# Patient Record
Sex: Female | Born: 1955
Health system: Southern US, Community
[De-identification: ages and names within clinical notes are randomized; demographics above are authoritative.]

## PROBLEM LIST (undated history)

## (undated) DIAGNOSIS — R112 Nausea with vomiting, unspecified: Secondary | ICD-10-CM

## (undated) DIAGNOSIS — Z9889 Other specified postprocedural states: Secondary | ICD-10-CM

## (undated) DIAGNOSIS — T7840XA Allergy, unspecified, initial encounter: Secondary | ICD-10-CM

## (undated) DIAGNOSIS — Z789 Other specified health status: Secondary | ICD-10-CM

## (undated) HISTORY — DX: Allergy, unspecified, initial encounter: T78.40XA

## (undated) HISTORY — PX: CHOLECYSTECTOMY: SHX55

---

## 1998-02-19 ENCOUNTER — Other Ambulatory Visit: Admission: RE | Admit: 1998-02-19 | Discharge: 1998-02-19 | Payer: Self-pay | Admitting: Family Medicine

## 1998-08-07 ENCOUNTER — Ambulatory Visit (HOSPITAL_BASED_OUTPATIENT_CLINIC_OR_DEPARTMENT_OTHER): Admission: RE | Admit: 1998-08-07 | Discharge: 1998-08-07 | Payer: Self-pay | Admitting: General Surgery

## 1999-08-30 ENCOUNTER — Other Ambulatory Visit: Admission: RE | Admit: 1999-08-30 | Discharge: 1999-08-30 | Payer: Self-pay | Admitting: Family Medicine

## 2000-09-21 ENCOUNTER — Other Ambulatory Visit: Admission: RE | Admit: 2000-09-21 | Discharge: 2000-09-21 | Payer: Self-pay | Admitting: Family Medicine

## 2000-09-27 ENCOUNTER — Encounter: Admission: RE | Admit: 2000-09-27 | Discharge: 2000-09-27 | Payer: Self-pay | Admitting: Family Medicine

## 2000-09-27 ENCOUNTER — Encounter: Payer: Self-pay | Admitting: Family Medicine

## 2003-06-13 ENCOUNTER — Other Ambulatory Visit: Admission: RE | Admit: 2003-06-13 | Discharge: 2003-06-13 | Payer: Self-pay | Admitting: Family Medicine

## 2004-10-29 ENCOUNTER — Other Ambulatory Visit: Admission: RE | Admit: 2004-10-29 | Discharge: 2004-10-29 | Payer: Self-pay | Admitting: Family Medicine

## 2005-10-13 ENCOUNTER — Other Ambulatory Visit: Admission: RE | Admit: 2005-10-13 | Discharge: 2005-10-13 | Payer: Self-pay | Admitting: Family Medicine

## 2013-02-06 ENCOUNTER — Ambulatory Visit (INDEPENDENT_AMBULATORY_CARE_PROVIDER_SITE_OTHER): Payer: BC Managed Care – PPO

## 2013-02-06 DIAGNOSIS — Z23 Encounter for immunization: Secondary | ICD-10-CM

## 2013-03-04 ENCOUNTER — Other Ambulatory Visit: Payer: Self-pay | Admitting: Family Medicine

## 2013-03-04 ENCOUNTER — Ambulatory Visit (INDEPENDENT_AMBULATORY_CARE_PROVIDER_SITE_OTHER): Payer: BC Managed Care – PPO

## 2013-03-04 ENCOUNTER — Ambulatory Visit (INDEPENDENT_AMBULATORY_CARE_PROVIDER_SITE_OTHER): Payer: BC Managed Care – PPO | Admitting: Family Medicine

## 2013-03-04 ENCOUNTER — Encounter: Payer: Self-pay | Admitting: Family Medicine

## 2013-03-04 VITALS — BP 124/80 | HR 82 | Temp 98.2°F | Ht 64.0 in | Wt 201.0 lb

## 2013-03-04 DIAGNOSIS — M25529 Pain in unspecified elbow: Secondary | ICD-10-CM

## 2013-03-04 DIAGNOSIS — M25521 Pain in right elbow: Secondary | ICD-10-CM

## 2013-03-04 DIAGNOSIS — M25429 Effusion, unspecified elbow: Secondary | ICD-10-CM

## 2013-03-04 DIAGNOSIS — M25421 Effusion, right elbow: Secondary | ICD-10-CM

## 2013-03-04 DIAGNOSIS — R52 Pain, unspecified: Secondary | ICD-10-CM

## 2013-03-04 MED ORDER — NAPROXEN 500 MG PO TABS: 500.0000 mg | ORAL_TABLET | Freq: Two times a day (BID) | ORAL | Status: DC

## 2013-03-04 MED ORDER — KETOROLAC TROMETHAMINE 30 MG/ML IJ SOLN
30.0000 mg | Freq: Once | INTRAMUSCULAR | Status: AC
  Administered 2013-03-04: 30 mg via INTRAMUSCULAR

## 2013-03-04 NOTE — Patient Instructions (Signed)
Carpal Tunnel Release Carpal tunnel release is done to relieve the pressure on the nerves and tendons on the bottom side of your wrist.  LET YOUR CAREGIVER KNOW ABOUT:   Allergies to food or medicine.  Medicines taken, including vitamins, herbs, eyedrops, over-the-counter medicines, and creams.  Use of steroids (by mouth or creams).  Previous problems with anesthetics or numbing medicines.  History of bleeding problems or blood clots.  Previous surgery.  Other health problems, including diabetes and kidney problems.  Possibility of pregnancy, if this applies. RISKS AND COMPLICATIONS  Some problems that may happen after this procedure include:  Infection.  Damage to the nerves, arteries or tendons could occur. This would be very uncommon.  Bleeding. BEFORE THE PROCEDURE   This surgery may be done while you are asleep (general anesthetic) or may be done under a block where only your forearm and the surgical area is numb.  If the surgery is done under a block, the numbness will gradually wear off within several hours after surgery. HOME CARE INSTRUCTIONS   Have a responsible person with you for 24 hours.  Do not drive a car or use public transportation for 24 hours.  Only take over-the-counter or prescription medicines for pain, discomfort, or fever as directed by your caregiver. Take them as directed.  You may put ice on the palm side of the affected wrist.  Put ice in a plastic bag.  Place a towel between your skin and the bag.  Leave the ice on for 20 to 30 minutes, 4 times per day.  If you were given a splint to keep your wrist from bending, use it as directed. It is important to wear the splint at night or as directed. Use the splint for as long as you have pain or numbness in your hand, arm, or wrist. This may take 1 to 2 months.  Keep your hand raised (elevated) above the level of your heart as much as possible. This keeps swelling down and helps with  discomfort.  Change bandages (dressings) as directed.  Keep the wound clean and dry. SEEK MEDICAL CARE IF:   You develop pain not relieved with medications.  You develop numbness of your hand.  You develop bleeding from your surgical site.  You have an oral temperature above 102 F (38.9 C).  You develop redness or swelling of the surgical site.  You develop new, unexplained problems. SEEK IMMEDIATE MEDICAL CARE IF:   You develop a rash.  You have difficulty breathing.  You develop any reaction or side effects to medications given. Document Released: 07/16/2003 Document Revised: 07/18/2011 Document Reviewed: 03/01/2007 [employer] Patient Information 2014 [employer].  

## 2013-03-04 NOTE — Progress Notes (Signed)
  Subjective:    Patient ID: Catherine Hudson, female    DOB: [birth year], 57 y.o.   MRN: [id number]  HPI This 57 y.o. female presents for evaluation of right elbow pain and discomfort after a fall The other day when blowing leaves..   Review of Systems C/o right elbow pain No chest pain, SOB, HA, dizziness, vision change, N/V, diarrhea, constipation, dysuria, urinary urgency or frequency, myalgias, arthralgias or rash.     Objective:   Physical Exam  Vital signs noted  Well developed well nourished female.  HEENT - Head atraumatic Normocephalic                Throat - oropharanx wnl Respiratory - Lungs CTA bilateral Cardiac - RRR S1 and S2 without murmur MS - TTP right elbow  Xray right elbow - No fx Prelimnary reading by Angeline Slim      Assessment & Plan:  Pain in joint, upper arm, right - Plan: DG Forearm Right, ketorolac (TORADOL) 30 MG/ML injection 30 mg, naproxen (NAPROSYN) 500 MG tablet Sling right elbow, work note for 2 days.  Follow up prn.  Deatra Canter FNP

## 2013-03-05 ENCOUNTER — Encounter: Payer: Self-pay | Admitting: Family Medicine

## 2013-03-05 ENCOUNTER — Telehealth: Payer: Self-pay | Admitting: *Deleted

## 2013-03-05 NOTE — Addendum Note (Signed)
Addended by: Bearl Mulberry on: 03/05/2013 03:44 PM   Modules accepted: Orders

## 2013-04-22 NOTE — Telephone Encounter (Signed)
NA

## 2013-09-10 ENCOUNTER — Telehealth: Payer: Self-pay | Admitting: Family Medicine

## 2013-09-10 NOTE — Telephone Encounter (Signed)
appt made

## 2013-09-11 ENCOUNTER — Encounter: Payer: Self-pay | Admitting: Nurse Practitioner

## 2013-09-11 ENCOUNTER — Ambulatory Visit (INDEPENDENT_AMBULATORY_CARE_PROVIDER_SITE_OTHER): Payer: BC Managed Care – PPO | Admitting: Nurse Practitioner

## 2013-09-11 VITALS — BP 118/81 | HR 81 | Temp 97.9°F | Ht 64.0 in | Wt 203.8 lb

## 2013-09-11 DIAGNOSIS — F411 Generalized anxiety disorder: Secondary | ICD-10-CM | POA: Insufficient documentation

## 2013-09-11 DIAGNOSIS — R079 Chest pain, unspecified: Secondary | ICD-10-CM

## 2013-09-11 DIAGNOSIS — K219 Gastro-esophageal reflux disease without esophagitis: Secondary | ICD-10-CM

## 2013-09-11 MED ORDER — PANTOPRAZOLE SODIUM 40 MG PO TBEC: 40.0000 mg | DELAYED_RELEASE_TABLET | Freq: Every day | ORAL | Status: DC

## 2013-09-11 NOTE — Progress Notes (Signed)
   Subjective:    Patient ID: Catherine Hudson, female    DOB: 1955/08/31, 58 y.o.   MRN: [id number]  HPI Patient in c/o when she gets up in the morning she feels nauseated,chest pain and blurred vision- Is happening every morning for the last week- Was intermittent prior to that. Sometimes occurs late rin the day as well.    Review of Systems  Constitutional: Positive for appetite change (decreased appetite). Negative for fever and chills.  HENT: Negative.   Respiratory: Negative.   Cardiovascular: Positive for chest pain. Negative for palpitations and leg swelling.  Gastrointestinal: Positive for nausea. Negative for diarrhea and constipation.  Genitourinary: Negative.   Musculoskeletal: Negative.   Psychiatric/Behavioral: Negative.   All other systems reviewed and are negative.      Objective:   Physical Exam  Constitutional: She is oriented to person, place, and time. She appears well-developed and well-nourished.  Cardiovascular: Normal rate, regular rhythm and normal heart sounds.   Pulmonary/Chest: Effort normal and breath sounds normal.  Abdominal: Soft. Bowel sounds are normal. She exhibits no distension and no mass. There is no tenderness. There is no rebound and no guarding.  Neurological: She is alert and oriented to person, place, and time.  Skin: Skin is warm and dry.  Psychiatric: She has a normal mood and affect. Her behavior is normal. Judgment and thought content normal.   BP 118/81  Pulse 81  Temp(Src) 97.9 F (36.6 C) (Oral)  Ht 5\' 4"  (1.626 m)  Wt 203 lb 12.8 oz (92.443 kg)  BMI 34.96 kg/m2 EKG Lillard Anesnormal-Mary-Margaret Kalina Morabito, FNP        Assessment & Plan:   1. Chest pain   2. GERD (gastroesophageal reflux disease)    Meds ordered this encounter  Medications  . diazepam (VALIUM) 10 MG tablet    Sig: Take 10 mg by mouth every 6 (six) hours as needed for anxiety.  . pantoprazole (PROTONIX) 40 MG tablet    Sig: Take 1 tablet (40 mg total) by mouth  daily.    Dispense:  30 tablet    Refill:  3    Order Specific Question:  Supervising Provider    Answer:  Ernestina PennaMOORE, DONALD W [[id number]]  smoking cessation avoid spicy and fatty foods Discussed possible hiatal henria- if no better will refer to GI No NSAIDs RTO prn Needs appointment for annual physical with blood work.  Mary-Margaret Daphine DeutscherMartin, FNP

## 2013-09-11 NOTE — Patient Instructions (Signed)
Hiatal Hernia A hiatal hernia occurs when a part of the stomach slides above the diaphragm. The diaphragm is the thin muscle separating the belly (abdomen) from the chest. A hiatal hernia can be something you are born with or develop over time. Hiatal hernias may allow stomach acid to flow back into your esophagus, the tube which carries food from your mouth to your stomach. If this acid causes problems it is called GERD (gastro-esophageal reflux disease).  SYMPTOMS  Common symptoms of GERD are heartburn (burning in your chest). This is worse when lying down or bending over. It may also cause belching and indigestion. Some of the things which make GERD worse are:  Increased weight pushes on stomach making acid rise more easily.  Smoking markedly increases acid production.  Alcohol decreases lower esophageal sphincter pressure (valve between stomach and esophagus), allowing acid from stomach into esophagus.  Late evening meals and going to bed with a full stomach increases pressure.  Anything that causes an increase in acid production.  Lower esophageal sphincter incompetence. DIAGNOSIS  Hiatal hernia is often diagnosed with x-rays of your stomach and small bowel. This is called an UGI (upper gastrointestinal x-ray). Sometimes a gastroscopic procedure is done. This is a procedure where your caregiver uses a flexible instrument to look into the stomach and small bowel. HOME CARE INSTRUCTIONS   Try to achieve and maintain an ideal body weight.  Avoid drinking alcoholic beverages.  Stop smoking.  Put the head of your bed on 4 to 6 inch blocks. This will keep your head and esophagus higher than your stomach. If you cannot use blocks, sleep with several pillows under your head and shoulders.  Over-the-counter medications will decrease acid production. Your caregiver can also prescribe medications for this. Take as directed.  1/2 to 1 teaspoon of an antacid taken every hour while awake, with  meals and at bedtime, will neutralize acid.  Do not take aspirin, ibuprofen (Advil or Motrin), or other nonsteroidal anti-inflammatory drugs.  Do not wear tight clothing around your chest or stomach.  Eat smaller meals and eat more frequently. This keeps your stomach from getting too full. Eat slowly.  Do not lie down for 2 or 3 hours after eating. Do not eat or drink anything 1 to 2 hours before going to bed.  Avoid caffeine beverages (colas, coffee, cocoa, tea), fatty foods, citrus fruits and all other foods and drinks that contain acid and that seem to increase the problems.  Avoid bending over, especially after eating. Also avoid straining during bowel movements or when urinating or lifting things. Anything that increases the pressure in your belly increases the amount of acid that may be pushed up into your esophagus. SEEK IMMEDIATE MEDICAL CARE IF:  There is change in location (pain in arms, neck, jaw, teeth or back) of your pain, or the pain is getting worse.  You also experience nausea, vomiting, sweating (diaphoresis), or shortness of breath.  You develop continual vomiting, vomit blood or coffee ground material, have bright red blood in your stools, or have black tarry stools. Some of these symptoms could signal other problems such as heart disease. MAKE SURE YOU:   Understand these instructions.  Monitor your condition.  Contact your caregiver if you are not doing well or are getting worse. Document Released: 07/16/2003 Document Revised: 07/18/2011 Document Reviewed: 04/25/2005 [facility] Patient Information 2014 [place], [place]. Diet for Gastroesophageal Reflux Disease, Adult Reflux (acid reflux) is when acid from your stomach flows up into the esophagus.  When acid comes in contact with the esophagus, the acid causes irritation and soreness (inflammation) in the esophagus. When reflux happens often or so severely that it causes damage to the esophagus, it is called  gastroesophageal reflux disease (GERD). Nutrition therapy can help ease the discomfort of GERD. FOODS OR DRINKS TO AVOID OR LIMIT  Smoking or chewing tobacco. Nicotine is one of the most potent stimulants to acid production in the gastrointestinal tract.  Caffeinated and decaffeinated coffee and black tea.  Regular or low-calorie carbonated beverages or energy drinks (caffeine-free carbonated beverages are allowed).   Strong spices, such as black pepper, white pepper, red pepper, cayenne, curry powder, and chili powder.  Peppermint or spearmint.  Chocolate.  High-fat foods, including meats and fried foods. Extra added fats including oils, butter, salad dressings, and nuts. Limit these to less than 8 tsp per day.  Fruits and vegetables if they are not tolerated, such as citrus fruits or tomatoes.  Alcohol.  Any food that seems to aggravate your condition. If you have questions regarding your diet, call your caregiver or a registered dietitian. OTHER THINGS THAT MAY HELP GERD INCLUDE:   Eating your meals slowly, in a relaxed setting.  Eating 5 to 6 small meals per day instead of 3 large meals.  Eliminating food for a period of time if it causes distress.  Not lying down until 3 hours after eating a meal.  Keeping the head of your bed raised 6 to 9 inches (15 to 23 cm) by using a foam wedge or blocks under the legs of the bed. Lying flat may make symptoms worse.  Being physically active. Weight loss may be helpful in reducing reflux in overweight or obese adults.  Wear loose fitting clothing EXAMPLE MEAL PLAN This meal plan is approximately 2,000 calories based on https://www.[place].org/ChooseMyPlate.gov meal planning guidelines. Breakfast   cup cooked oatmeal.  1 cup strawberries.  1 cup low-fat milk.  1 oz almonds. Snack  1 cup cucumber slices.  6 oz yogurt (made from low-fat or fat-free milk). Lunch  2 slice whole-wheat bread.  2 oz sliced [place].  2 tsp mayonnaise.  1 cup  blueberries.  1 cup snap peas. Snack  6 whole-wheat crackers.  1 oz string cheese. Dinner   cup brown rice.  1 cup mixed veggies.  1 tsp olive oil.  3 oz grilled fish. Document Released: 04/25/2005 Document Revised: 07/18/2011 Document Reviewed: 03/11/2011 [facility] Patient Information 2014 [place]ExitCare, [place].

## 2013-12-03 LAB — HM MAMMOGRAPHY: HM MAMMO: NEGATIVE

## 2013-12-04 ENCOUNTER — Other Ambulatory Visit: Payer: Self-pay | Admitting: Nurse Practitioner

## 2013-12-04 ENCOUNTER — Other Ambulatory Visit: Payer: BC Managed Care – PPO

## 2013-12-04 DIAGNOSIS — K219 Gastro-esophageal reflux disease without esophagitis: Secondary | ICD-10-CM

## 2013-12-04 DIAGNOSIS — Z0000 Encounter for general adult medical examination without abnormal findings: Secondary | ICD-10-CM

## 2013-12-04 DIAGNOSIS — R079 Chest pain, unspecified: Secondary | ICD-10-CM

## 2013-12-05 LAB — NMR, LIPOPROFILE
CHOLESTEROL: 212 mg/dL — AB (ref 100–199)
HDL CHOLESTEROL BY NMR: 63 mg/dL (ref >39)
HDL PARTICLE NUMBER: 46 umol/L (ref >=30.5)
LDL PARTICLE NUMBER: 1612 nmol/L — AB (ref <1000)
LDL Size: 20.8 nm (ref >20.5)
LDLC SERPL CALC-MCNC: 122 mg/dL — AB (ref 0–99)
LP-IR Score: 50 — ABNORMAL HIGH (ref <=45)
Small LDL Particle Number: 674 nmol/L — ABNORMAL HIGH (ref <=527)
TRIGLYCERIDES BY NMR: 133 mg/dL (ref 0–149)

## 2013-12-05 LAB — CMP14+EGFR
A/G RATIO: 2 (ref 1.1–2.5)
ALT: 11 IU/L (ref 0–32)
AST: 10 IU/L (ref 0–40)
Albumin: 4.2 g/dL (ref 3.5–5.5)
Alkaline Phosphatase: 54 IU/L (ref 39–117)
BILIRUBIN TOTAL: 0.8 mg/dL (ref 0.0–1.2)
BUN/Creatinine Ratio: 16 (ref 9–23)
BUN: 13 mg/dL (ref 6–24)
CO2: 23 mmol/L (ref 18–29)
CREATININE: 0.79 mg/dL (ref 0.57–1.00)
Calcium: 9.2 mg/dL (ref 8.7–10.2)
Chloride: 105 mmol/L (ref 97–108)
GFR, EST AFRICAN AMERICAN: 96 mL/min/{1.73_m2} (ref >59)
GFR, EST NON AFRICAN AMERICAN: 83 mL/min/{1.73_m2} (ref >59)
GLOBULIN, TOTAL: 2.1 g/dL (ref 1.5–4.5)
Glucose: 97 mg/dL (ref 65–99)
POTASSIUM: 4.4 mmol/L (ref 3.5–5.2)
SODIUM: 141 mmol/L (ref 134–144)
Total Protein: 6.3 g/dL (ref 6.0–8.5)

## 2013-12-11 ENCOUNTER — Telehealth: Payer: Self-pay | Admitting: Family Medicine

## 2013-12-11 NOTE — Telephone Encounter (Signed)
Message copied by Azalee CourseFULP, ASHLEY on Wed Dec 11, 2013  8:09 AM ------      Message from: Bennie PieriniMARTIN, MARY-MARGARET      Created: Fri Dec 06, 2013  1:16 PM       LDL particle numbers and LDL are high- low CVA risk due to know other-  rmedical problems- strict low fat diet and exercise- recheck in 6 months      Kidney and liver function stable       ------

## 2014-02-19 ENCOUNTER — Ambulatory Visit (INDEPENDENT_AMBULATORY_CARE_PROVIDER_SITE_OTHER): Payer: BC Managed Care – PPO

## 2014-02-19 DIAGNOSIS — Z23 Encounter for immunization: Secondary | ICD-10-CM

## 2014-07-31 ENCOUNTER — Ambulatory Visit: Payer: Self-pay | Admitting: Family Medicine

## 2015-03-30 NOTE — Patient Instructions (Signed)
Your procedure is scheduled on:  04/06/2015  [facility].  Call this number if you have problems the morning of surgery: [phone]   Remember:   Do not eat or drink :After Midnight.    Take these medicines the morning of surgery with A SIP OF WATER: Zyrtec   Do not wear jewelry, make-up or nail polish.  Do not wear lotions, powders, or perfumes. You may wear deodorant.  Do not shave 48 hours prior to surgery.  Do not bring valuables to the hospital.  Contacts, dentures or bridgework may not be worn into surgery.  Patients discharged the day of surgery will not be allowed to drive home.  Name and phone number of your driver:    Please read over the following fact sheets that you were given: Pain Booklet, Surgical Site Infection Prevention, Anesthesia Post-op Instructions and Care and Recovery After Surgery  Cataract Surgery  A cataract is a clouding of the lens of the eye. When a lens becomes cloudy, vision is reduced based on the degree and nature of the clouding. Surgery may be needed to improve vision. Surgery removes the cloudy lens and usually replaces it with a substitute lens (intraocular lens, IOL). LET YOUR EYE DOCTOR KNOW ABOUT:  Allergies to food or medicine.   Medicines taken including herbs, eyedrops, over-the-counter medicines, and creams.   Use of steroids (by mouth or creams).   Previous problems with anesthetics or numbing medicine.   History of bleeding problems or blood clots.   Previous surgery.   Other health problems, including diabetes and kidney problems.   Possibility of pregnancy, if this applies.  RISKS AND COMPLICATIONS  Infection.   Inflammation of the eyeball (endophthalmitis) that can spread to both eyes (sympathetic ophthalmia).   Poor wound healing.   If an IOL is inserted, it can later fall out of proper position. This is very uncommon.   Clouding of the part of your eye that holds an IOL in place. This is  called an "after-cataract." These are uncommon, but easily treated.  BEFORE THE PROCEDURE  Do not eat or drink anything except small amounts of water for 8 to 12 before your surgery, or as directed by your caregiver.   Unless you are told otherwise, continue any eyedrops you have been prescribed.   Talk to your primary caregiver about all other medicines that you take (both prescription and non-prescription). In some cases, you may need to stop or change medicines near the time of your surgery. This is most important if you are taking blood-thinning medicine.Do not stop medicines unless you are told to do so.   Arrange for someone to drive you to and from the procedure.   Do not put contact lenses in either eye on the day of your surgery.  PROCEDURE There is more than one method for safely removing a cataract. Your doctor can explain the differences and help determine which is best for you. Phacoemulsification surgery is the most common form of cataract surgery.  An injection is given behind the eye or eyedrops are given to make this a painless procedure.   A small cut (incision) is made on the edge of the clear, dome-shaped surface that covers the front of the eye (cornea).   A tiny probe is painlessly inserted into the eye. This device gives off ultrasound waves that soften and break up the cloudy center of the lens. This makes it easier for  the cloudy lens to be removed by suction.   An IOL may be implanted.   The normal lens of the eye is covered by a clear capsule. Part of that capsule is intentionally left in the eye to support the IOL.   Your surgeon may or may not use stitches to close the incision.  There are other forms of cataract surgery that require a larger incision and stiches to close the eye. This approach is taken in cases where the doctor feels that the cataract cannot be easily removed using phacoemulsification. AFTER THE PROCEDURE  When an IOL is implanted, it  does not need care. It becomes a permanent part of your eye and cannot be seen or felt.   Your doctor will schedule follow-up exams to check on your progress.   Review your other medicines with your doctor to see which can be resumed after surgery.   Use eyedrops or take medicine as prescribed by your doctor.  Document Released: 04/14/2011 Document Reviewed: 04/11/2011 [facility] Patient Information 2012 [place].  .Cataract Surgery Care After Refer to this sheet in the next few weeks. These instructions provide you with information on caring for yourself after your procedure. Your caregiver may also give you more specific instructions. Your treatment has been planned according to current medical practices, but problems sometimes occur. Call your caregiver if you have any problems or questions after your procedure.  HOME CARE INSTRUCTIONS   Avoid strenuous activities as directed by your caregiver.   Ask your caregiver when you can resume driving.   Use eyedrops or other medicines to help healing and control pressure inside your eye as directed by your caregiver.   Only take over-the-counter or prescription medicines for pain, discomfort, or fever as directed by your caregiver.   Do not to touch or rub your eyes.   You may be instructed to use a protective shield during the first few days and nights after surgery. If not, wear sunglasses to protect your eyes. This is to protect the eye from pressure or from being accidentally bumped.   Keep the area around your eye clean and dry. Avoid swimming or allowing water to hit you directly in the face while showering. Keep soap and shampoo out of your eyes.   Do not bend or lift heavy objects. Bending increases pressure in the eye. You can walk, climb stairs, and do light household chores.   Do not put a contact lens into the eye that had surgery until your caregiver says it is okay to do so.   Ask your doctor when you can return to  work. This will depend on the kind of work that you do. If you work in a dusty environment, you may be advised to wear protective eyewear for a period of time.   Ask your caregiver when it will be safe to engage in sexual activity.   Continue with your regular eye exams as directed by your caregiver.  What to expect:  It is normal to feel itching and mild discomfort for a few days after cataract surgery. Some fluid discharge is also common, and your eye may be sensitive to light and touch.   After 1 to 2 days, even moderate discomfort should disappear. In most cases, healing will take about 6 weeks.   If you received an intraocular lens (IOL), you may notice that colors are very bright or have a blue tinge. Also, if you have been in bright sunlight, everything may appear  reddish for a few hours. If you see these color tinges, it is because your lens is clear and no longer cloudy. Within a few months after receiving an IOL, these extra colors should go away. When you have healed, you will probably need new glasses.  SEEK MEDICAL CARE IF:   You have increased bruising around your eye.   You have discomfort not helped by medicine.  SEEK IMMEDIATE MEDICAL CARE IF:   You have a fever.   You have a worsening or sudden vision loss.   You have redness, swelling, or increasing pain in the eye.   You have a thick discharge from the eye that had surgery.  MAKE SURE YOU:  Understand these instructions.   Will watch your condition.   Will get help right away if you are not doing well or get worse.  Document Released: 11/12/2004 Document Revised: 04/14/2011 Document Reviewed: 12/17/2010 [facility] Patient Information 2012 [place].    Monitored Anesthesia Care  Monitored anesthesia care is an anesthesia service for a medical procedure. Anesthesia is the loss of the ability to feel pain. It is produced by medications called anesthetics. It may affect a small area of your body (local  anesthesia), a large area of your body (regional anesthesia), or your entire body (general anesthesia). The need for monitored anesthesia care depends your procedure, your condition, and the potential need for regional or general anesthesia. It is often provided during procedures where:   General anesthesia may be needed if there are complications. This is because you need special care when you are under general anesthesia.   You will be under local or regional anesthesia. This is so that you are able to have higher levels of anesthesia if needed.   You will receive calming medications (sedatives). This is especially the case if sedatives are given to put you in a semi-conscious state of relaxation (deep sedation). This is because the amount of sedative needed to produce this state can be hard to predict. Too much of a sedative can produce general anesthesia. Monitored anesthesia care is performed by one or more caregivers who have special training in all types of anesthesia. You will need to meet with these caregivers before your procedure. During this meeting, they will ask you about your medical history. They will also give you instructions to follow. (For example, you will need to stop eating and drinking before your procedure. You may also need to stop or change medications you are taking.) During your procedure, your caregivers will stay with you. They will:   Watch your condition. This includes watching you blood pressure, breathing, and level of pain.   Diagnose and treat problems that occur.   Give medications if they are needed. These may include calming medications (sedatives) and anesthetics.   Make sure you are comfortable.  Having monitored anesthesia care does not necessarily mean that you will be under anesthesia. It does mean that your caregivers will be able to manage anesthesia if you need it or if it occurs. It also means that you will be able to have a different type of  anesthesia than you are having if you need it. When your procedure is complete, your caregivers will continue to watch your condition. They will make sure any medications wear off before you are allowed to go home.  Document Released: 01/19/2005 Document Revised: 08/20/2012 Document Reviewed: 06/06/2012 [facility] Patient Information 2014 [place], [place].

## 2015-03-31 ENCOUNTER — Other Ambulatory Visit: Payer: Self-pay

## 2015-03-31 ENCOUNTER — Encounter (HOSPITAL_COMMUNITY): Payer: Self-pay

## 2015-03-31 ENCOUNTER — Encounter (HOSPITAL_COMMUNITY)
Admission: RE | Admit: 2015-03-31 | Discharge: 2015-03-31 | Disposition: A | Discharge status: Home / Self Care | Payer: BLUE CROSS/BLUE SHIELD | Source: Ambulatory Visit | Attending: Ophthalmology | Admitting: Ophthalmology

## 2015-03-31 DIAGNOSIS — Z01818 Encounter for other preprocedural examination: Secondary | ICD-10-CM | POA: Diagnosis present

## 2015-03-31 DIAGNOSIS — H2511 Age-related nuclear cataract, right eye: Secondary | ICD-10-CM | POA: Diagnosis not present

## 2015-03-31 HISTORY — DX: Nausea with vomiting, unspecified: R11.2

## 2015-03-31 HISTORY — DX: Other specified postprocedural states: Z98.890

## 2015-03-31 LAB — BASIC METABOLIC PANEL
ANION GAP: 6 (ref 5–15)
BUN: 18 mg/dL (ref 6–20)
CALCIUM: 9.2 mg/dL (ref 8.9–10.3)
CO2: 22 mmol/L (ref 22–32)
Chloride: 110 mmol/L (ref 101–111)
Creatinine, Ser: 0.71 mg/dL (ref 0.44–1.00)
Glucose, Bld: 96 mg/dL (ref 65–99)
POTASSIUM: 4.4 mmol/L (ref 3.5–5.1)
SODIUM: 138 mmol/L (ref 135–145)

## 2015-03-31 LAB — CBC
HCT: 40.1 % (ref 36.0–46.0)
Hemoglobin: 13.4 g/dL (ref 12.0–15.0)
MCH: 31 pg (ref 26.0–34.0)
MCHC: 33.4 g/dL (ref 30.0–36.0)
MCV: 92.8 fL (ref 78.0–100.0)
PLATELETS: 194 10*3/uL (ref 150–400)
RBC: 4.32 MIL/uL (ref 3.87–5.11)
RDW: 12.6 % (ref 11.5–15.5)
WBC: 8.9 10*3/uL (ref 4.0–10.5)

## 2015-03-31 NOTE — Pre-Procedure Instructions (Signed)
Patient given information to sign up for my chart at home. 

## 2015-04-06 ENCOUNTER — Ambulatory Visit (HOSPITAL_COMMUNITY)
Admission: RE | Admit: 2015-04-06 | Discharge: 2015-04-06 | Disposition: A | Discharge status: Home / Self Care | Payer: BLUE CROSS/BLUE SHIELD | Source: Ambulatory Visit | Attending: Ophthalmology | Admitting: Ophthalmology

## 2015-04-06 ENCOUNTER — Ambulatory Visit (HOSPITAL_COMMUNITY): Payer: BLUE CROSS/BLUE SHIELD | Admitting: Anesthesiology

## 2015-04-06 ENCOUNTER — Encounter (HOSPITAL_COMMUNITY): Payer: Self-pay

## 2015-04-06 ENCOUNTER — Encounter (HOSPITAL_COMMUNITY)
Admission: RE | Disposition: A | Discharge status: Home / Self Care | Payer: Self-pay | Source: Ambulatory Visit | Attending: Ophthalmology

## 2015-04-06 DIAGNOSIS — H25811 Combined forms of age-related cataract, right eye: Secondary | ICD-10-CM | POA: Diagnosis present

## 2015-04-06 HISTORY — DX: Other specified health status: Z78.9

## 2015-04-06 HISTORY — PX: CATARACT EXTRACTION W/PHACO: SHX586

## 2015-04-06 SURGERY — PHACOEMULSIFICATION, CATARACT, WITH IOL INSERTION
Anesthesia: Monitor Anesthesia Care | Site: Eye | Laterality: Right

## 2015-04-06 MED ORDER — EPINEPHRINE HCL 1 MG/ML IJ SOLN
INTRAOCULAR | Status: DC | PRN
  Administered 2015-04-06: 13:00:00

## 2015-04-06 MED ORDER — MIDAZOLAM HCL 2 MG/2ML IJ SOLN
1.0000 mg | INTRAMUSCULAR | Status: DC | PRN
  Administered 2015-04-06: 2 mg via INTRAVENOUS

## 2015-04-06 MED ORDER — LIDOCAINE HCL (PF) 1 % IJ SOLN
INTRAMUSCULAR | Status: DC | PRN
  Administered 2015-04-06: .6 mL

## 2015-04-06 MED ORDER — TETRACAINE HCL 0.5 % OP SOLN
1.0000 [drp] | OPHTHALMIC | Status: AC
  Administered 2015-04-06 (×3): 1 [drp] via OPHTHALMIC

## 2015-04-06 MED ORDER — LIDOCAINE HCL 3.5 % OP GEL
1.0000 "application " | Freq: Once | OPHTHALMIC | Status: AC
  Administered 2015-04-06: 1 via OPHTHALMIC

## 2015-04-06 MED ORDER — PROVISC 10 MG/ML IO SOLN
INTRAOCULAR | Status: DC | PRN
  Administered 2015-04-06: 0.85 mL via INTRAOCULAR

## 2015-04-06 MED ORDER — BSS IO SOLN
INTRAOCULAR | Status: DC | PRN
  Administered 2015-04-06: 15 mL via INTRAOCULAR

## 2015-04-06 MED ORDER — NEOMYCIN-POLYMYXIN-DEXAMETH 3.5-10000-0.1 OP SUSP
OPHTHALMIC | Status: DC | PRN
  Administered 2015-04-06: 2 [drp] via OPHTHALMIC

## 2015-04-06 MED ORDER — EPINEPHRINE HCL 1 MG/ML IJ SOLN
INTRAMUSCULAR | Status: AC
  Filled 2015-04-06: qty 1

## 2015-04-06 MED ORDER — FENTANYL CITRATE (PF) 100 MCG/2ML IJ SOLN
25.0000 ug | INTRAMUSCULAR | Status: AC
  Administered 2015-04-06 (×2): 25 ug via INTRAVENOUS

## 2015-04-06 MED ORDER — LIDOCAINE 3.5 % OP GEL OPTIME - NO CHARGE
OPHTHALMIC | Status: DC | PRN
  Administered 2015-04-06: 2 [drp] via OPHTHALMIC

## 2015-04-06 MED ORDER — LACTATED RINGERS IV SOLN
INTRAVENOUS | Status: DC
  Administered 2015-04-06: 13:00:00 via INTRAVENOUS

## 2015-04-06 MED ORDER — FENTANYL CITRATE (PF) 100 MCG/2ML IJ SOLN
INTRAMUSCULAR | Status: AC
  Filled 2015-04-06: qty 2

## 2015-04-06 MED ORDER — POVIDONE-IODINE 5 % OP SOLN
OPHTHALMIC | Status: DC | PRN
  Administered 2015-04-06: 1 via OPHTHALMIC

## 2015-04-06 MED ORDER — PHENYLEPHRINE HCL 2.5 % OP SOLN
1.0000 [drp] | OPHTHALMIC | Status: AC
  Administered 2015-04-06 (×3): 1 [drp] via OPHTHALMIC

## 2015-04-06 MED ORDER — CYCLOPENTOLATE-PHENYLEPHRINE 0.2-1 % OP SOLN
1.0000 [drp] | OPHTHALMIC | Status: AC
  Administered 2015-04-06 (×3): 1 [drp] via OPHTHALMIC

## 2015-04-06 MED ORDER — MIDAZOLAM HCL 2 MG/2ML IJ SOLN
INTRAMUSCULAR | Status: AC
  Filled 2015-04-06: qty 2

## 2015-04-06 SURGICAL SUPPLY — 34 items
CAPSULAR TENSION RING-AMO (OPHTHALMIC RELATED) IMPLANT
CLOTH BEACON ORANGE TIMEOUT ST (SAFETY) ×3 IMPLANT
EYE SHIELD UNIVERSAL CLEAR (GAUZE/BANDAGES/DRESSINGS) ×3 IMPLANT
GLOVE BIO SURGEON STRL SZ 6.5 (GLOVE) IMPLANT
GLOVE BIO SURGEONS STRL SZ 6.5 (GLOVE)
GLOVE BIOGEL PI IND STRL 6.5 (GLOVE) ×1 IMPLANT
GLOVE BIOGEL PI IND STRL 7.0 (GLOVE) IMPLANT
GLOVE BIOGEL PI IND STRL 7.5 (GLOVE) IMPLANT
GLOVE BIOGEL PI INDICATOR 6.5 (GLOVE) ×2
GLOVE BIOGEL PI INDICATOR 7.0 (GLOVE)
GLOVE BIOGEL PI INDICATOR 7.5 (GLOVE)
GLOVE ECLIPSE 6.5 STRL STRAW (GLOVE) IMPLANT
GLOVE ECLIPSE 7.0 STRL STRAW (GLOVE) IMPLANT
GLOVE ECLIPSE 7.5 STRL STRAW (GLOVE) IMPLANT
GLOVE EXAM NITRILE LRG STRL (GLOVE) IMPLANT
GLOVE EXAM NITRILE MD LF STRL (GLOVE) IMPLANT
GLOVE SKINSENSE NS SZ6.5 (GLOVE)
GLOVE SKINSENSE NS SZ7.0 (GLOVE)
GLOVE SKINSENSE STRL SZ6.5 (GLOVE) IMPLANT
GLOVE SKINSENSE STRL SZ7.0 (GLOVE) IMPLANT
KIT VITRECTOMY (OPHTHALMIC RELATED) IMPLANT
PAD ARMBOARD 7.5X6 YLW CONV (MISCELLANEOUS) ×3 IMPLANT
PROC W NO LENS (INTRAOCULAR LENS)
PROC W SPEC LENS (INTRAOCULAR LENS)
PROCESS W NO LENS (INTRAOCULAR LENS) IMPLANT
PROCESS W SPEC LENS (INTRAOCULAR LENS) IMPLANT
RETRACTOR IRIS SIGHTPATH (OPHTHALMIC RELATED) IMPLANT
RING MALYGIN (MISCELLANEOUS) IMPLANT
SIGHTPATH CAT PROC W REG LENS (Ophthalmic Related) ×3 IMPLANT
SYRINGE LUER LOK 1CC (MISCELLANEOUS) ×3 IMPLANT
TAPE SURG TRANSPARENT 2IN (GAUZE/BANDAGES/DRESSINGS) ×1 IMPLANT
TAPE TRANSPARENT 2IN (GAUZE/BANDAGES/DRESSINGS) ×2
VISCOELASTIC ADDITIONAL (OPHTHALMIC RELATED) IMPLANT
WATER STERILE IRR 250ML POUR (IV SOLUTION) ×3 IMPLANT

## 2015-04-06 NOTE — Anesthesia Procedure Notes (Signed)
Procedure Name: MAC Date/Time: 04/06/2015 1:08 PM Performed by: Franco NonesYATES, Lakeitha Basques S Pre-anesthesia Checklist: Patient identified, Emergency Drugs available, Suction available, Timeout performed and Patient being monitored Patient Re-evaluated:Patient Re-evaluated prior to inductionOxygen Delivery Method: Nasal Cannula

## 2015-04-06 NOTE — Anesthesia Postprocedure Evaluation (Signed)
  Anesthesia Post-op Note  Patient: Catherine Hudson  Procedure(s) Performed: Procedure(s) (LRB): CATARACT EXTRACTION PHACO AND INTRAOCULAR LENS PLACEMENT (IOC) (Right)  Patient Location:  Short Stay  Anesthesia Type: MAC  Level of Consciousness: awake  Airway and Oxygen Therapy: Patient Spontanous Breathing  Post-op Pain: none  Post-op Assessment: Post-op Vital signs reviewed, Patient's Cardiovascular Status Stable, Respiratory Function Stable, Patent Airway, No signs of Nausea or vomiting and Pain level controlled  Post-op Vital Signs: Reviewed and stable  Complications: No apparent anesthesia complications

## 2015-04-06 NOTE — H&P (Signed)
I have reviewed the H&P, the patient was re-examined, and I have identified no interval changes in medical condition and plan of care since the history and physical of record  

## 2015-04-06 NOTE — Anesthesia Preprocedure Evaluation (Signed)
Anesthesia Evaluation  Patient identified by MRN, date of birth, ID band Patient awake    Reviewed: Allergy & Precautions, NPO status , Patient's Chart, lab work & pertinent test results  History of Anesthesia Complications (+) PONV and history of anesthetic complications  Airway Mallampati: II  TM Distance: >3 FB     Dental  (+) Teeth Intact   Pulmonary Current Smoker,    breath sounds clear to auscultation       Cardiovascular negative cardio ROS   Rhythm:Regular Rate:Normal     Neuro/Psych PSYCHIATRIC DISORDERS Anxiety    GI/Hepatic negative GI ROS,   Endo/Other    Renal/GU      Musculoskeletal   Abdominal   Peds  Hematology   Anesthesia Other Findings   Reproductive/Obstetrics                             Anesthesia Physical Anesthesia Plan  ASA: II  Anesthesia Plan: MAC   Post-op Pain Management:    Induction: Intravenous  Airway Management Planned: Nasal Cannula  Additional Equipment:   Intra-op Plan:   Post-operative Plan:   Informed Consent: I have reviewed the patients History and Physical, chart, labs and discussed the procedure including the risks, benefits and alternatives for the proposed anesthesia with the patient or authorized representative who has indicated his/her understanding and acceptance.     Plan Discussed with:   Anesthesia Plan Comments:         Anesthesia Quick Evaluation

## 2015-04-06 NOTE — Transfer of Care (Signed)
Immediate Anesthesia Transfer of Care Note  Patient: Catherine Hudson  Procedure(s) Performed: Procedure(s) (LRB): CATARACT EXTRACTION PHACO AND INTRAOCULAR LENS PLACEMENT (IOC) (Right)  Patient Location: Shortstay  Anesthesia Type: MAC  Level of Consciousness: awake  Airway & Oxygen Therapy: Patient Spontanous Breathing   Post-op Assessment: Report given to PACU RN, Post -op Vital signs reviewed and stable and Patient moving all extremities  Post vital signs: Reviewed and stable  Complications: No apparent anesthesia complications

## 2015-04-06 NOTE — Discharge Instructions (Signed)

## 2015-04-06 NOTE — Op Note (Signed)
Date of Admission: 04/06/2015  Date of Surgery: 04/06/2015   Pre-Op Dx: Cataract Right Eye  Post-Op Dx: Senile Combined Cataract Right  Eye,  Dx Code W09.811H25.811  Surgeon: Gemma PayorKerry Silena Wyss, M.D.  Assistants: None  Anesthesia: Topical with MAC  Indications: Painless, progressive loss of vision with compromise of daily activities.  Surgery: Cataract Extraction with Intraocular lens Implant Right Eye  Discription: The patient had dilating drops and viscous lidocaine placed into the Right eye in the pre-op holding area. After transfer to the operating room, a time out was performed. The patient was then prepped and draped. Beginning with a 75 degree blade a paracentesis port was made at the surgeon's 2 o'clock position. The anterior chamber was then filled with 1% non-preserved lidocaine. This was followed by filling the anterior chamber with Provisc.  A 2.144mm keratome blade was used to make a clear corneal incision at the temporal limbus.  A bent cystatome needle was used to create a continuous tear capsulotomy. Hydrodissection was performed with balanced salt solution on a Fine canula. The lens nucleus was then removed using the phacoemulsification handpiece. Residual cortex was removed with the I&A handpiece. The anterior chamber and capsular bag were refilled with Provisc. A posterior chamber intraocular lens was placed into the capsular bag with it's injector. The implant was positioned with the Kuglan hook. The Provisc was then removed from the anterior chamber and capsular bag with the I&A handpiece. Stromal hydration of the main incision and paracentesis port was performed with BSS on a Fine canula. The wounds were tested for leak which was negative. The patient tolerated the procedure well. There were no operative complications. The patient was then transferred to the recovery room in stable condition.  Complications: None  Specimen: None  EBL: None  Prosthetic device: Hoya iSert 250, power 19.5  D, SN [id number].

## 2015-04-07 ENCOUNTER — Encounter (HOSPITAL_COMMUNITY): Payer: Self-pay | Admitting: Ophthalmology

## 2015-05-14 IMAGING — CR DG ELBOW COMPLETE 3+V*R*
4 series · 4 of 4 positions shown · non-contrast
Comparison: None.

CLINICAL DATA: Elbow pain status post fall.

EXAM:
RIGHT ELBOW - COMPLETE 3+ VIEW

[view not recorded (1 of 4)]
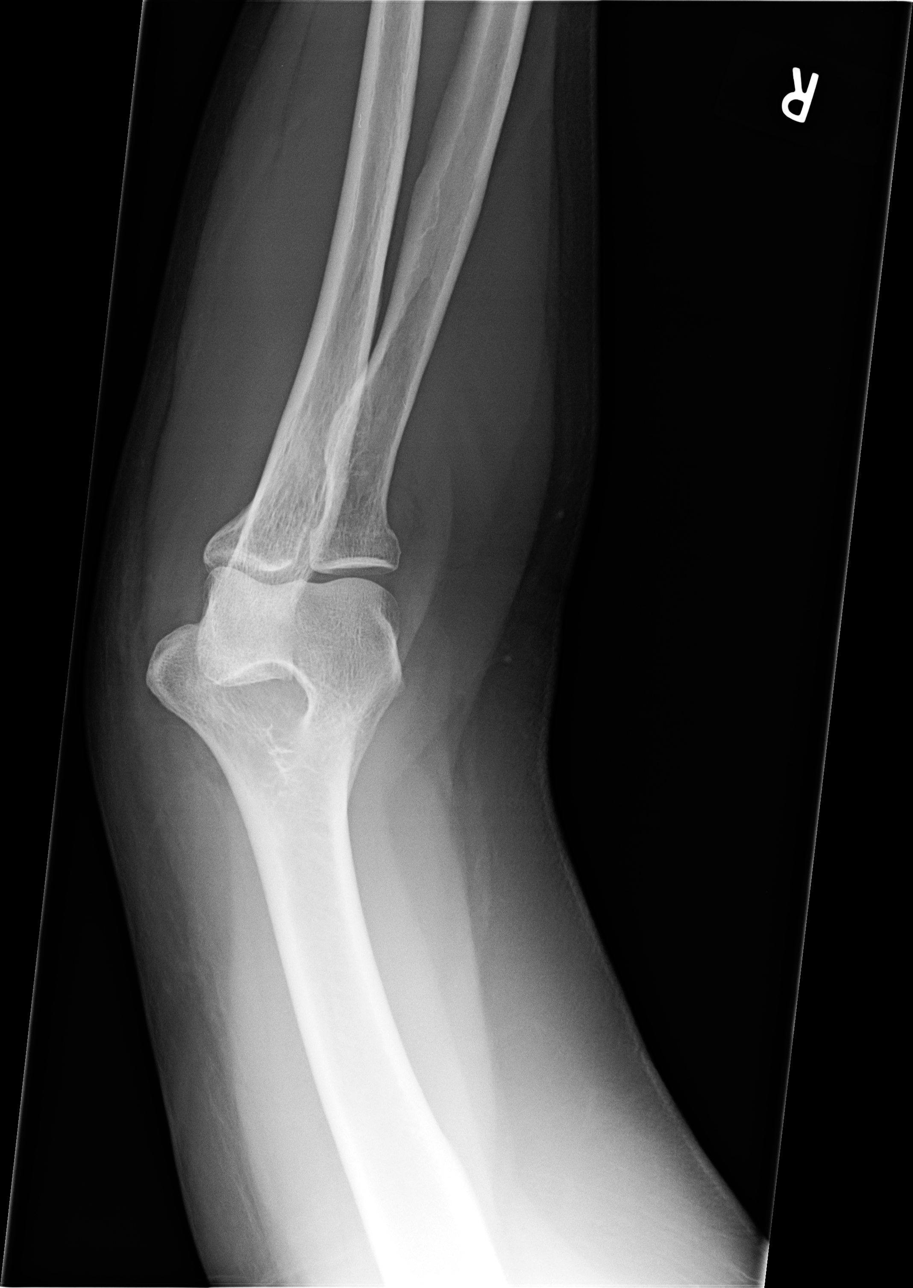

[view not recorded (2 of 4)]
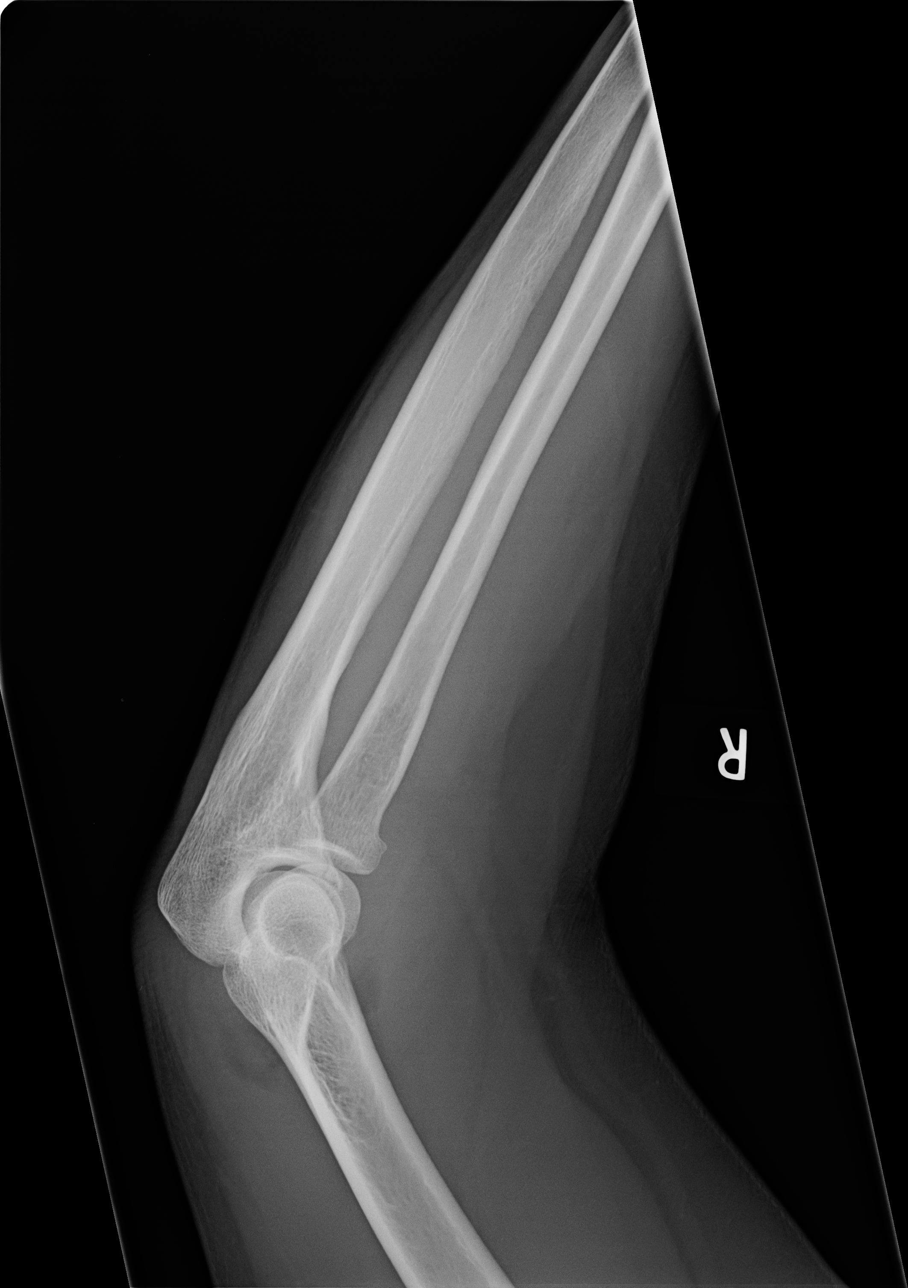

[view not recorded (3 of 4)]
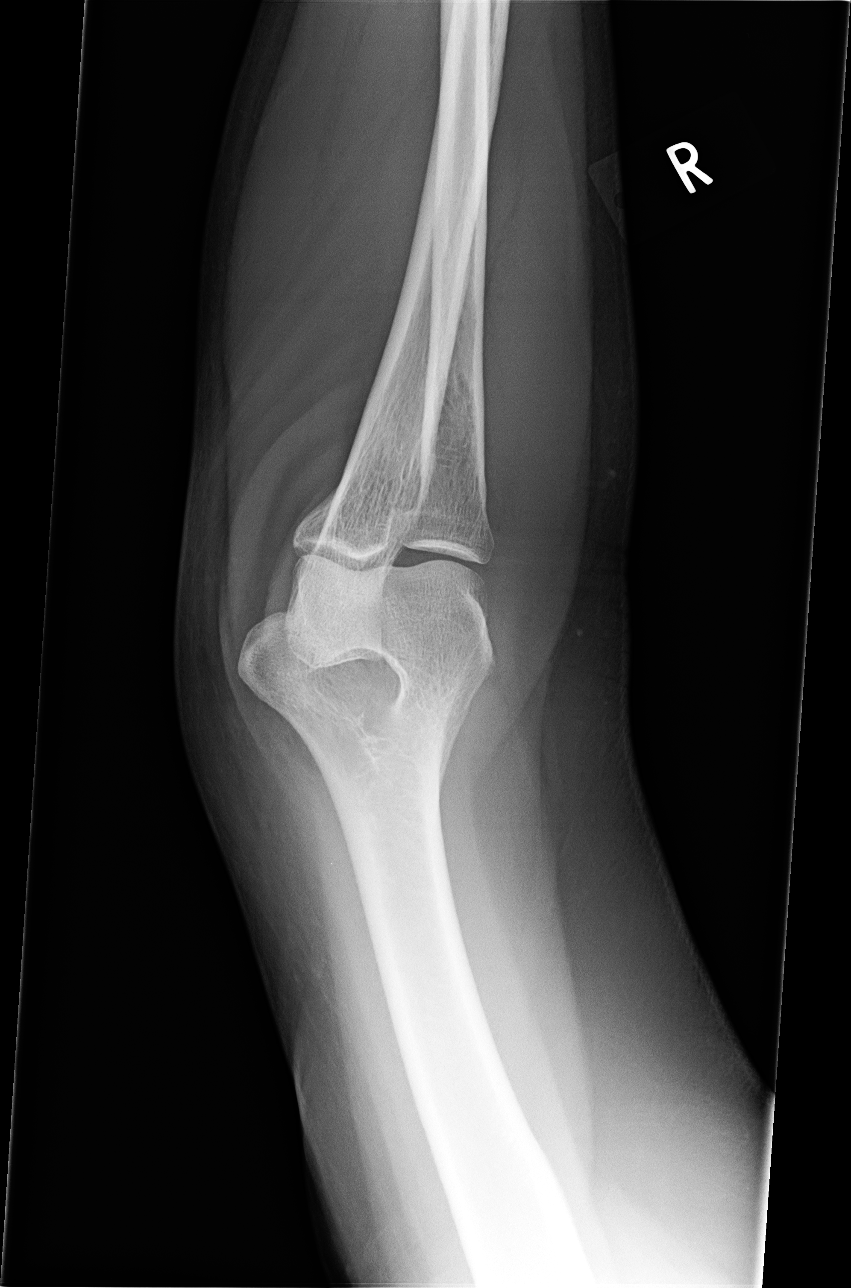

[view not recorded (4 of 4)]
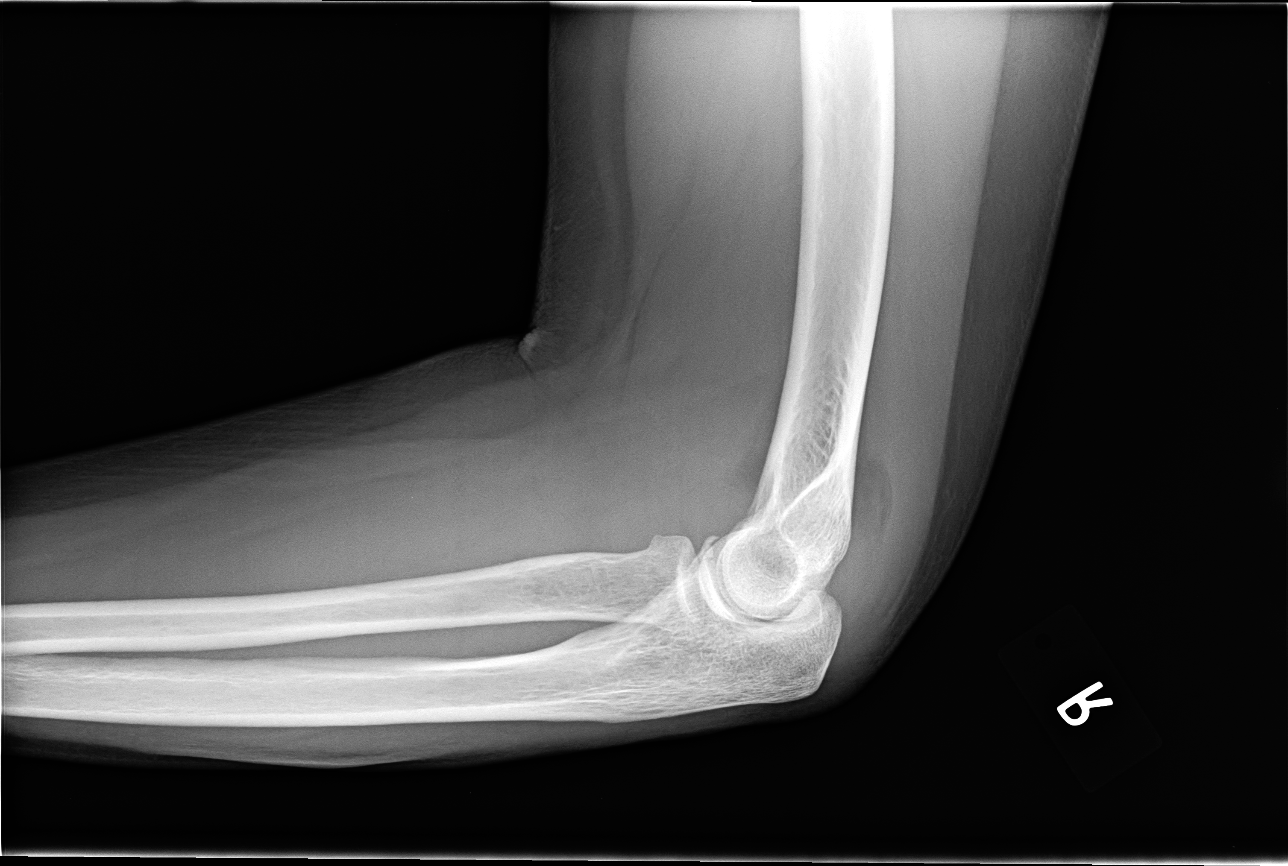

[4 of 4 positions shown; findings below may reference images not displayed]

FINDINGS: There is a large elbow joint effusion. There is a probable
nondisplaced fracture of the coronoid process of the proximal ulna.
No definite other acute fracture or dislocation is identified. The
radial head appears intact.
IMPRESSION: Large elbow joint effusion, likely related to a nondisplaced
fracture of the coronoid process. No other fractures are identified.
Immobilization and radiographic follow up recommended.

These results will be called to the ordering clinician or
representative by the Radiologist Assistant, and communication
documented in the PACS Dashboard.

## 2015-06-22 ENCOUNTER — Ambulatory Visit (INDEPENDENT_AMBULATORY_CARE_PROVIDER_SITE_OTHER): Payer: Self-pay | Admitting: Family Medicine

## 2015-06-22 ENCOUNTER — Encounter: Payer: Self-pay | Admitting: Family Medicine

## 2015-06-22 VITALS — BP 135/90 | HR 79 | Temp 97.4°F | Ht 64.0 in | Wt 203.6 lb

## 2015-06-22 DIAGNOSIS — Z1211 Encounter for screening for malignant neoplasm of colon: Secondary | ICD-10-CM

## 2015-06-22 DIAGNOSIS — Z114 Encounter for screening for human immunodeficiency virus [HIV]: Secondary | ICD-10-CM

## 2015-06-22 DIAGNOSIS — L821 Other seborrheic keratosis: Secondary | ICD-10-CM

## 2015-06-22 DIAGNOSIS — Z23 Encounter for immunization: Secondary | ICD-10-CM

## 2015-06-22 DIAGNOSIS — Z1159 Encounter for screening for other viral diseases: Secondary | ICD-10-CM

## 2015-06-22 DIAGNOSIS — R7309 Other abnormal glucose: Secondary | ICD-10-CM

## 2015-06-22 DIAGNOSIS — Z0000 Encounter for general adult medical examination without abnormal findings: Secondary | ICD-10-CM

## 2015-06-22 DIAGNOSIS — B354 Tinea corporis: Secondary | ICD-10-CM

## 2015-06-22 DIAGNOSIS — E669 Obesity, unspecified: Secondary | ICD-10-CM

## 2015-06-22 MED ORDER — CLOTRIMAZOLE-BETAMETHASONE 1-0.05 % EX CREA: 1.0000 "application " | TOPICAL_CREAM | Freq: Two times a day (BID) | CUTANEOUS | Status: DC

## 2015-06-22 NOTE — Progress Notes (Signed)
BP 135/90 mmHg  Pulse 79  Temp(Src) 97.4 F (36.3 C) (Oral)  Ht _0  (1.626 m)  Wt 203 lb 9.6 oz (92.352 kg)  BMI 34.93 kg/m2   Subjective:    Patient ID: Catherine Hudson, female    DOB: 1955/10/08, 60 y.o.   MRN: [id number]  HPI: Catherine Hudson is a 60 y.o. female presenting on 06/22/2015 for Right foot pain; Spot on left shin & left shoulder; and Labwork   HPI Well adult exam Patient is coming in today for well adult exam and health maintenance and labs. She denies any chest pain, shortness of breath, headaches or vision issues, abdominal complaints, diarrhea, nausea, vomiting, or joint issues.   Spots of skin Patient has 2 spots on her skin that she is concerned about one is a pink round spot on her left shin and that one has been there for at least a couple weeks that she has noticed and it appears to be growing in size. She doesn't note any pruritus or drainage. The second spot on her skin is on her left anterior shoulder near her bra strap and it is more hyperpigmented and released from his been there for at least a year but has been growing more recently. Denies any drainage or irritation or erythema associated with that one.  Relevant past medical, surgical, family and social history reviewed and updated as indicated. Interim medical history since our last visit reviewed. Allergies and medications reviewed and updated.  Review of Systems  Constitutional: Negative for fever and chills.  HENT: Negative for congestion, ear discharge and ear pain.   Eyes: Negative for redness and visual disturbance.  Respiratory: Negative for chest tightness and shortness of breath.   Cardiovascular: Negative for chest pain and leg swelling.  Genitourinary: Negative for dysuria and difficulty urinating.  Musculoskeletal: Negative for back pain and gait problem.  Skin: Positive for rash.  Neurological: Negative for light-headedness and headaches.  Psychiatric/Behavioral: Negative for behavioral  problems and agitation.  All other systems reviewed and are negative.   Per HPI unless specifically indicated above    Medication List       This list is accurate as of: 06/22/15  8:35 AM.  Always use your most recent med list.               cetirizine 10 MG tablet  Commonly known as:  ZYRTEC  Take 10 mg by mouth daily as needed for allergies.          Objective:    BP 135/90 mmHg  Pulse 79  Temp(Src) 97.4 F (36.3 C) (Oral)  Ht _1  (1.626 m)  Wt 203 lb 9.6 oz (92.352 kg)  BMI 34.93 kg/m2  Wt Readings from Last 3 Encounters:  06/22/15 203 lb 9.6 oz (92.352 kg)  03/31/15 206 lb (93.441 kg)  09/11/13 203 lb 12.8 oz (92.443 kg)    Physical Exam  Constitutional: She is oriented to person, place, and time. She appears well-developed and well-nourished. No distress.  Eyes: Conjunctivae and EOM are normal. Pupils are equal, round, and reactive to light.  Neck: Neck supple. No thyromegaly present.  Cardiovascular: Normal rate, regular rhythm, normal heart sounds and intact distal pulses.   No murmur heard. Pulmonary/Chest: Effort normal and breath sounds normal. No respiratory distress. She has no wheezes.  Musculoskeletal: Normal range of motion. She exhibits no edema or tenderness.  Lymphadenopathy:    She has no cervical adenopathy.  Neurological: She is alert  and oriented to person, place, and time. Coordination normal.  Skin: Skin is warm and dry. Lesion (patient has 2 lesions #1 is a circular pink macule on left shin that is about three quarters of a centimeter in size. #2 lesion is on anterior left shoulder near the bra line and it is hyperpigmented with a rough surface and looks like a stuck on appearanc) noted. No rash noted. She is not diaphoretic.  Psychiatric: She has a normal mood and affect. Her behavior is normal.  Nursing note and vitals reviewed.      Assessment & Plan:   Problem List Items Addressed This Visit    None    Visit Diagnoses    Screen  for colon cancer    -  Primary    Relevant Orders    Ambulatory referral to Gastroenterology    Obesity        Relevant Orders    CMP14+EGFR (Completed)    Lipid panel (Completed)    Screening for HIV without presence of risk factors        Relevant Orders    HIV antibody (Completed)    Need for hepatitis C screening test        Relevant Orders    Hepatitis C antibody (Completed)    Well adult exam        Ringworm of body        Left anterior leg, send cream    Relevant Medications    clotrimazole-betamethasone (LOTRISONE) cream    Seborrheic keratosis        Lesion on arm is an SK and is completely benign.        Follow up plan: Return in about 1 year (around 06/21/2016), or if symptoms worsen or fail to improve.  Counseling provided for all of the vaccine components Orders Placed This Encounter  Procedures  . CMP14+EGFR  . Lipid panel  . HIV antibody  . Hepatitis C antibody  . Ambulatory referral to Gastroenterology    Caryl Pina, MD [facility] 06/22/2015, 8:35 AM

## 2015-06-23 LAB — LIPID PANEL
CHOLESTEROL TOTAL: 213 mg/dL — AB (ref 100–199)
Chol/HDL Ratio: 3 ratio units (ref 0.0–4.4)
HDL: 72 mg/dL (ref >39)
LDL Calculated: 121 mg/dL — ABNORMAL HIGH (ref 0–99)
TRIGLYCERIDES: 99 mg/dL (ref 0–149)
VLDL Cholesterol Cal: 20 mg/dL (ref 5–40)

## 2015-06-23 LAB — CMP14+EGFR
ALBUMIN: 4.4 g/dL (ref 3.5–5.5)
ALK PHOS: 63 IU/L (ref 39–117)
ALT: 15 IU/L (ref 0–32)
AST: 16 IU/L (ref 0–40)
Albumin/Globulin Ratio: 1.8 (ref 1.1–2.5)
BILIRUBIN TOTAL: 1.2 mg/dL (ref 0.0–1.2)
BUN / CREAT RATIO: 18 (ref 9–23)
BUN: 15 mg/dL (ref 6–24)
CHLORIDE: 103 mmol/L (ref 96–106)
CO2: 23 mmol/L (ref 18–29)
Calcium: 9.3 mg/dL (ref 8.7–10.2)
Creatinine, Ser: 0.84 mg/dL (ref 0.57–1.00)
GFR calc Af Amer: 89 mL/min/{1.73_m2} (ref >59)
GFR calc non Af Amer: 77 mL/min/{1.73_m2} (ref >59)
GLUCOSE: 104 mg/dL — AB (ref 65–99)
Globulin, Total: 2.5 g/dL (ref 1.5–4.5)
Potassium: 5.2 mmol/L (ref 3.5–5.2)
SODIUM: 141 mmol/L (ref 134–144)
Total Protein: 6.9 g/dL (ref 6.0–8.5)

## 2015-06-23 LAB — HIV ANTIBODY (ROUTINE TESTING W REFLEX): HIV Screen 4th Generation wRfx: NONREACTIVE

## 2015-06-23 LAB — HEPATITIS C ANTIBODY

## 2015-06-24 LAB — POCT GLYCOSYLATED HEMOGLOBIN (HGB A1C): Hemoglobin A1C: 5.5

## 2015-06-24 NOTE — Addendum Note (Signed)
Addended by: Tommas Olp on: 06/24/2015 06:08 PM   Modules accepted: Orders

## 2015-06-30 ENCOUNTER — Encounter: Payer: Self-pay | Admitting: *Deleted

## 2015-07-09 LAB — HM MAMMOGRAPHY: HM Mammogram: NEGATIVE

## 2015-07-30 ENCOUNTER — Encounter: Payer: Self-pay | Admitting: *Deleted

## 2016-02-05 ENCOUNTER — Encounter: Payer: Self-pay | Admitting: Family

## 2016-02-05 ENCOUNTER — Ambulatory Visit (INDEPENDENT_AMBULATORY_CARE_PROVIDER_SITE_OTHER): Payer: BLUE CROSS/BLUE SHIELD | Admitting: Family

## 2016-02-05 VITALS — BP 144/82 | HR 108 | Temp 102.0°F | Ht 64.0 in | Wt 205.2 lb

## 2016-02-05 DIAGNOSIS — L03221 Cellulitis of neck: Secondary | ICD-10-CM | POA: Diagnosis not present

## 2016-02-05 DIAGNOSIS — T148 Other injury of unspecified body region: Secondary | ICD-10-CM

## 2016-02-05 DIAGNOSIS — W57XXXA Bitten or stung by nonvenomous insect and other nonvenomous arthropods, initial encounter: Secondary | ICD-10-CM

## 2016-02-05 MED ORDER — CEFTRIAXONE SODIUM 1 G IJ SOLR
1.0000 g | Freq: Once | INTRAMUSCULAR | Status: AC
  Administered 2016-02-05: 1 g via INTRAMUSCULAR

## 2016-02-05 MED ORDER — DOXYCYCLINE HYCLATE 100 MG PO TABS: 100.0000 mg | ORAL_TABLET | Freq: Two times a day (BID) | ORAL | 0 refills | Status: DC

## 2016-02-05 NOTE — Patient Instructions (Signed)

## 2016-02-05 NOTE — Addendum Note (Signed)
Addended by: Jannifer RodneyHAWKS, Kimbree Casanas A on: 02/05/2016 03:34 PM   Modules accepted: Orders

## 2016-02-05 NOTE — Progress Notes (Addendum)
   Subjective:    Patient ID: Catherine SentersDonna M Kreuser, female    DOB: 25-Apr-1956, 60 y.o.   MRN: [id number]  Pt presents to the office today with neck pain. Pt states she was sitting outside and had a "few insect" bits on Wednesday night, but did not think much about it. Then Thursday morning when she woke up her neck was red, warm, and having constant pain 9 out 10. Pt has tried benadryl cream and hydrocortisone cream with no relief. Pt states she has a fever, nausea, and fatigue.  Rash  This is a new problem. The current episode started in the past 7 days (Wednesday). Associated symptoms include fatigue and a fever. Pertinent negatives include no shortness of breath.  Fever   Associated symptoms include nausea and a rash. Pertinent negatives include no headaches.  Neck Pain   Associated symptoms include a fever. Pertinent negatives include no headaches.      Review of Systems  Constitutional: Positive for chills, fatigue and fever.  Respiratory: Negative.  Negative for shortness of breath.   Cardiovascular: Negative.   Gastrointestinal: Positive for nausea.  Genitourinary: Negative.   Musculoskeletal: Positive for neck pain.  Skin: Positive for rash.  Neurological: Negative for seizures, syncope, speech difficulty and headaches.       Objective:   Physical Exam  Constitutional: She is oriented to person, place, and time. She appears well-developed and well-nourished.  Cardiovascular: Normal rate, regular rhythm, normal heart sounds and intact distal pulses.   Pulmonary/Chest: Effort normal and breath sounds normal.  Musculoskeletal: Normal range of motion. She exhibits no edema.  Neurological: She is alert and oriented to person, place, and time.  Skin: Skin is warm and dry. Rash noted. There is erythema.  Generalized erythemas rash on left neck 16X20 cm, warm to touch, erythemas papule in middle    BP (!) 144/82   Pulse (!) 108   Temp (!) 102 F (38.9 C) (Oral)   Ht 5\' 4"  (1.626 m)    Wt 205 lb 3.2 oz (93.1 kg)   BMI 35.22 kg/m        Assessment & Plan:  1. Cellulitis of neck - doxycycline (VIBRA-TABS) 100 MG tablet; Take 1 tablet (100 mg total) by mouth 2 (two) times daily.  Dispense: 20 tablet; Refill: 0 - cefTRIAXone (ROCEPHIN) injection 1 g; Inject 1 g into the muscle once.  2. Insect bite - doxycycline (VIBRA-TABS) 100 MG tablet; Take 1 tablet (100 mg total) by mouth 2 (two) times daily.  Dispense: 20 tablet; Refill: 0 - cefTRIAXone (ROCEPHIN) injection 1 g; Inject 1 g into the muscle once.   Cool compresses Tylenol or motrin prn for pain Area marked, if worsens or does not improve RTO office!1 RTO in 4 days   Jannifer Rodneyhristy Hawks, FNP

## 2016-02-08 ENCOUNTER — Ambulatory Visit (INDEPENDENT_AMBULATORY_CARE_PROVIDER_SITE_OTHER): Payer: BLUE CROSS/BLUE SHIELD | Admitting: Family

## 2016-02-08 ENCOUNTER — Encounter: Payer: Self-pay | Admitting: Family

## 2016-02-08 VITALS — BP 126/85 | HR 95 | Temp 97.8°F | Ht 64.0 in | Wt 199.0 lb

## 2016-02-08 DIAGNOSIS — L03221 Cellulitis of neck: Secondary | ICD-10-CM | POA: Diagnosis not present

## 2016-02-08 DIAGNOSIS — W57XXXD Bitten or stung by nonvenomous insect and other nonvenomous arthropods, subsequent encounter: Secondary | ICD-10-CM

## 2016-02-08 MED ORDER — FLUCONAZOLE 150 MG PO TABS: 150.0000 mg | ORAL_TABLET | Freq: Once | ORAL | 0 refills | Status: AC

## 2016-02-08 NOTE — Patient Instructions (Signed)

## 2016-02-08 NOTE — Progress Notes (Addendum)
   Subjective:    Patient ID: Catherine Hudson, female    DOB: 1955-06-06, 60 y.o.   MRN: [id number]  HPI Pt presents to the office today to recheck cellulitis of neck. Pt was seen with fever 102 and started on doxycycline 100 mg BID for 10 days. Pt states her fever is improved and redness and swelling on neck improved. PT states she continues to feel weak and "drained".    Review of Systems  All other systems reviewed and are negative.      Objective:   Physical Exam  Constitutional: She is oriented to person, place, and time. She appears well-developed and well-nourished. No distress.  Cardiovascular: Normal rate, regular rhythm, normal heart sounds and intact distal pulses.   No murmur heard. Pulmonary/Chest: Effort normal and breath sounds normal. No respiratory distress. She has no wheezes.  Abdominal: Soft. Bowel sounds are normal. She exhibits no distension. There is no tenderness.  Musculoskeletal: Normal range of motion. She exhibits no edema or tenderness.  Neurological: She is alert and oriented to person, place, and time.  Skin: Skin is warm and dry. Rash noted.  Erythemas on right side of neck, 21CM X 16 cm, darkness of erythemas improved, no swelling or warmth noted  Psychiatric: She has a normal mood and affect. Her behavior is normal. Judgment and thought content normal.  Vitals reviewed.     BP 126/85   Pulse 95   Temp 97.8 F (36.6 C) (Oral)   Ht 5\' 4"  (1.626 m)   Wt 199 lb (90.3 kg)   BMI 34.16 kg/m      Assessment & Plan:  1. Cellulitis of neck  2. Insect bite, subsequent encounter  Continue doxycycline Call office if redness, warmth, fever, or pain worsens Area marked RTO in 1 week after completion of antibiotic   Jannifer Rodneyhristy Opal Mckellips, FNP

## 2016-02-08 NOTE — Addendum Note (Signed)
Addended by: Jannifer RodneyHAWKS, Darriona Dehaas A on: 02/08/2016 10:44 AM   Modules accepted: Orders

## 2016-06-28 ENCOUNTER — Other Ambulatory Visit: Payer: Self-pay | Admitting: Family Medicine

## 2016-06-28 DIAGNOSIS — B354 Tinea corporis: Secondary | ICD-10-CM

## 2017-06-03 ENCOUNTER — Other Ambulatory Visit: Payer: Self-pay | Admitting: Family Medicine

## 2017-06-03 DIAGNOSIS — B354 Tinea corporis: Secondary | ICD-10-CM

## 2020-01-28 ENCOUNTER — Other Ambulatory Visit: Payer: Self-pay | Admitting: Family

## 2020-02-18 ENCOUNTER — Ambulatory Visit: Payer: Commercial Managed Care - PPO | Admitting: Family Medicine

## 2020-02-18 ENCOUNTER — Other Ambulatory Visit: Payer: Self-pay

## 2020-02-18 ENCOUNTER — Encounter: Payer: Self-pay | Admitting: Family Medicine

## 2020-02-18 VITALS — BP 140/78 | HR 71 | Temp 96.9°F | Ht 64.0 in | Wt 192.4 lb

## 2020-02-18 DIAGNOSIS — Z6833 Body mass index (BMI) 33.0-33.9, adult: Secondary | ICD-10-CM | POA: Insufficient documentation

## 2020-02-18 DIAGNOSIS — R030 Elevated blood-pressure reading, without diagnosis of hypertension: Secondary | ICD-10-CM | POA: Insufficient documentation

## 2020-02-18 DIAGNOSIS — Z139 Encounter for screening, unspecified: Secondary | ICD-10-CM | POA: Diagnosis not present

## 2020-02-18 DIAGNOSIS — Z716 Tobacco abuse counseling: Secondary | ICD-10-CM

## 2020-02-18 DIAGNOSIS — Z23 Encounter for immunization: Secondary | ICD-10-CM

## 2020-02-18 DIAGNOSIS — Z1211 Encounter for screening for malignant neoplasm of colon: Secondary | ICD-10-CM

## 2020-02-18 DIAGNOSIS — Z720 Tobacco use: Secondary | ICD-10-CM

## 2020-02-18 LAB — URINALYSIS
Bilirubin, UA: NEGATIVE
Glucose, UA: NEGATIVE
Ketones, UA: NEGATIVE
Leukocytes,UA: NEGATIVE
Nitrite, UA: NEGATIVE
Protein,UA: NEGATIVE
RBC, UA: NEGATIVE
Specific Gravity, UA: 1.015 (ref 1.005–1.030)
Urobilinogen, Ur: 0.2 mg/dL (ref 0.2–1.0)
pH, UA: 6.5 (ref 5.0–7.5)

## 2020-02-18 LAB — CMP14+EGFR

## 2020-02-18 LAB — THYROID PANEL WITH TSH

## 2020-02-18 LAB — CBC WITH DIFFERENTIAL/PLATELET

## 2020-02-18 LAB — LIPID PANEL

## 2020-02-18 NOTE — Patient Instructions (Addendum)
Blood Pressure Record Sheet To take your blood pressure, you will need a blood pressure machine. You can buy a blood pressure machine (blood pressure monitor) at your clinic, drug store, or online. When choosing one, consider:  An automatic monitor that has an arm cuff.  A cuff that wraps snugly around your upper arm. You should be able to fit only one finger between your arm and the cuff.  A device that stores blood pressure reading results.  Do not choose a monitor that measures your blood pressure from your wrist or finger. Follow your health care provider's instructions for how to take your blood pressure. To use this form:  Get one reading in the morning (a.m.) before you take any medicines.  Get one reading in the evening (p.m.) before supper.  Take at least 2 readings with each blood pressure check. This makes sure the results are correct. Wait 1-2 minutes between measurements.  Write down the results in the spaces on this form.  Repeat this once a week, or as told by your health care provider.  Make a follow-up appointment with your health care provider to discuss the results. Blood pressure log Date: _______________________  a.m. _____________________(1st reading) _____________________(2nd reading)  p.m. _____________________(1st reading) _____________________(2nd reading) Date: _______________________  a.m. _____________________(1st reading) _____________________(2nd reading)  p.m. _____________________(1st reading) _____________________(2nd reading) Date: _______________________  a.m. _____________________(1st reading) _____________________(2nd reading)  p.m. _____________________(1st reading) _____________________(2nd reading) Date: _______________________  a.m. _____________________(1st reading) _____________________(2nd reading)  p.m. _____________________(1st reading) _____________________(2nd reading) Date: _______________________  a.m.  _____________________(1st reading) _____________________(2nd reading)  p.m. _____________________(1st reading) _____________________(2nd reading) This information is not intended to replace advice given to you by your health care provider. Make sure you discuss any questions you have with your health care provider. Document Revised: 06/23/2017 Document Reviewed: 04/25/2017 Elsevier Patient Education  2020 [employer] Inc[employer] DASH Eating Plan DASH stands for "Dietary Approaches to Stop Hypertension." The DASH eating plan is a healthy eating plan that has been shown to reduce high blood pressure (hypertension). It may also reduce your risk for type 2 diabetes, heart disease, and stroke. The DASH eating plan may also help with weight loss. What are tips for following this plan?  General guidelines  Avoid eating more than 2,300 mg (milligrams) of salt (sodium) a day. If you have hypertension, you may need to reduce your sodium intake to 1,500 mg a day.  Limit alcohol intake to no more than 1 drink a day for nonpregnant women and 2 drinks a day for men. One drink equals 12 oz of beer, 5 oz of wine, or 1 oz of hard liquor.  Work with your health care provider to maintain a healthy body weight or to lose weight. Ask what an ideal weight is for you.  Get at least 30 minutes of exercise that causes your heart to beat faster (aerobic exercise) most days of the week. Activities may include walking, swimming, or biking.  Work with your health care provider or diet and nutrition specialist (dietitian) to adjust your eating plan to your individual calorie needs. Reading food labels   Check food labels for the amount of sodium per serving. Choose foods with less than 5 percent of the Daily Value of sodium. Generally, foods with less than 300 mg of sodium per serving fit into this eating plan.  To find whole grains, look for the word "whole" as the first word in the ingredient list. Shopping  Buy products  labeled as "low-sodium" or "no salt added."  Buy  fresh foods. Avoid canned foods and premade or frozen meals. Cooking  Avoid adding salt when cooking. Use salt-free seasonings or herbs instead of table salt or sea salt. Check with your health care provider or pharmacist before using salt substitutes.  Do not fry foods. Cook foods using healthy methods such as baking, boiling, grilling, and broiling instead.  Cook with heart-healthy oils, such as olive, canola, soybean, or sunflower oil. Meal planning  Eat a balanced diet that includes: ? 5 or more servings of fruits and vegetables each day. At each meal, try to fill half of your plate with fruits and vegetables. ? Up to 6-8 servings of whole grains each day. ? Less than 6 oz of lean meat, poultry, or fish each day. A 3-oz serving of meat is about the same size as a deck of cards. One egg equals 1 oz. ? 2 servings of low-fat dairy each day. ? A serving of nuts, seeds, or beans 5 times each week. ? Heart-healthy fats. Healthy fats called Omega-3 fatty acids are found in foods such as flaxseeds and coldwater fish, like sardines, salmon, and mackerel.  Limit how much you eat of the following: ? Canned or prepackaged foods. ? Food that is high in trans fat, such as fried foods. ? Food that is high in saturated fat, such as fatty meat. ? Sweets, desserts, sugary drinks, and other foods with added sugar. ? Full-fat dairy products.  Do not salt foods before eating.  Try to eat at least 2 vegetarian meals each week.  Eat more home-cooked food and less restaurant, buffet, and fast food.  When eating at a restaurant, ask that your food be prepared with less salt or no salt, if possible. What foods are recommended? The items listed may not be a complete list. Talk with your dietitian about what dietary choices are best for you. Grains Whole-grain or whole-wheat bread. Whole-grain or whole-wheat pasta. Brown rice. Orpah Cobb. Bulgur.  Whole-grain and low-sodium cereals. Pita bread. Low-fat, low-sodium crackers. Whole-wheat flour tortillas. Vegetables Fresh or frozen vegetables (raw, steamed, roasted, or grilled). Low-sodium or reduced-sodium tomato and vegetable juice. Low-sodium or reduced-sodium tomato sauce and tomato paste. Low-sodium or reduced-sodium canned vegetables. Fruits All fresh, dried, or frozen fruit. Canned fruit in natural juice (without added sugar). Meat and other protein foods Skinless chicken or [place]. Ground chicken or [place]. Pork with fat trimmed off. Fish and seafood. Egg whites. Dried beans, peas, or lentils. Unsalted nuts, nut butters, and seeds. Unsalted canned beans. Lean cuts of beef with fat trimmed off. Low-sodium, lean deli meat. Dairy Low-fat (1%) or fat-free (skim) milk. Fat-free, low-fat, or reduced-fat cheeses. Nonfat, low-sodium ricotta or cottage cheese. Low-fat or nonfat yogurt. Low-fat, low-sodium cheese. Fats and oils Soft margarine without trans fats. Vegetable oil. Low-fat, reduced-fat, or light mayonnaise and salad dressings (reduced-sodium). Canola, safflower, olive, soybean, and sunflower oils. Avocado. Seasoning and other foods Herbs. Spices. Seasoning mixes without salt. Unsalted popcorn and pretzels. Fat-free sweets. What foods are not recommended? The items listed may not be a complete list. Talk with your dietitian about what dietary choices are best for you. Grains Baked goods made with fat, such as croissants, muffins, or some breads. Dry pasta or rice meal packs. Vegetables Creamed or fried vegetables. Vegetables in a cheese sauce. Regular canned vegetables (not low-sodium or reduced-sodium). Regular canned tomato sauce and paste (not low-sodium or reduced-sodium). Regular tomato and vegetable juice (not low-sodium or reduced-sodium). Rosita Fire. Olives. Fruits Canned fruit in a light or  heavy syrup. Fried fruit. Fruit in cream or butter sauce. Meat and other protein  foods Fatty cuts of meat. Ribs. Fried meat. Tomasa Blase. Sausage. Bologna and other processed lunch meats. Salami. Fatback. Hotdogs. Bratwurst. Salted nuts and seeds. Canned beans with added salt. Canned or smoked fish. Whole eggs or egg yolks. Chicken or [place] with skin. Dairy Whole or 2% milk, cream, and half-and-half. Whole or full-fat cream cheese. Whole-fat or sweetened yogurt. Full-fat cheese. Nondairy creamers. Whipped toppings. Processed cheese and cheese spreads. Fats and oils Butter. Stick margarine. Lard. Shortening. Ghee. Bacon fat. Tropical oils, such as coconut, palm kernel, or palm oil. Seasoning and other foods Salted popcorn and pretzels. Onion salt, garlic salt, seasoned salt, table salt, and sea salt. Worcestershire sauce. Tartar sauce. Barbecue sauce. Teriyaki sauce. Soy sauce, including reduced-sodium. Steak sauce. Canned and packaged gravies. Fish sauce. Oyster sauce. Cocktail sauce. Horseradish that you find on the shelf. Ketchup. Mustard. Meat flavorings and tenderizers. Bouillon cubes. Hot sauce and Tabasco sauce. Premade or packaged marinades. Premade or packaged taco seasonings. Relishes. Regular salad dressings. Where to find more information:  National Heart, Lung, and Blood Institute: PopSteam.is  American Heart Association: www.heart.org Summary  The DASH eating plan is a healthy eating plan that has been shown to reduce high blood pressure (hypertension). It may also reduce your risk for type 2 diabetes, heart disease, and stroke.  With the DASH eating plan, you should limit salt (sodium) intake to 2,300 mg a day. If you have hypertension, you may need to reduce your sodium intake to 1,500 mg a day.  When on the DASH eating plan, aim to eat more fresh fruits and vegetables, whole grains, lean proteins, low-fat dairy, and heart-healthy fats.  Work with your health care provider or diet and nutrition specialist (dietitian) to adjust your eating plan to your  individual calorie needs. This information is not intended to replace advice given to you by your health care provider. Make sure you discuss any questions you have with your health care provider. Document Revised: 04/07/2017 Document Reviewed: 04/18/2016 Elsevier Patient Education  2020 [employer].

## 2020-02-18 NOTE — Progress Notes (Signed)
New Patient Office Visit  Subjective:  Patient ID: Catherine Hudson, female    DOB: Aug 17, 1955  Age: 64 y.o. MRN: [id number]  CC:  Chief Complaint  Patient presents with   New Patient (Initial Visit)    HPI Catherine Hudson presents for initial visit to establish care. She has not been since by a provider since 2017. She denies medical conditions. She is a current every day smoker up to 1/2 PPD for 25 years. She reports that her diet is "bad" and she does not currently exercise. She has been taking care of her husband after having surgery lately. She does not check her BP at home, but does have a monitor to do so. She has had a dental cleaning this year. She is fasting this morning.   She is not UTD for her pap and mammogram. She would prefer to see GYN for a pap. She will call the location she previously had mammograms performed to schedule. She has never had a colonoscopy and denies personal or family hx of colon cancer.   Past Medical History:  Diagnosis Date   Allergy    Medical history non-contributory    PONV (postoperative nausea and vomiting)     Past Surgical History:  Procedure Laterality Date   CATARACT EXTRACTION W/PHACO Right 04/06/2015   Procedure: CATARACT EXTRACTION PHACO AND INTRAOCULAR LENS PLACEMENT ([place]);  Surgeon: Tonny Branch, MD;  Location: [facility];  Service: Ophthalmology;  Laterality: Right;  CDE:8.27   CHOLECYSTECTOMY      Family History  Problem Relation Age of Onset   Hypertension Mother    Diabetes Father    Hypertension Father    Stroke Father     Social History   Socioeconomic History   Marital status: Married    Spouse name: Shanon Brow   Number of children: 0   Years of education: 11   Highest education level: 11th grade  Occupational History    Employer: [employer]  Tobacco Use   Smoking status: Current Every Day Smoker    Packs/day: 0.50    Years: 30.00    Pack years: 15.00    Types: Cigarettes   Smokeless tobacco:  Never Used  Scientific laboratory technician Use: Never used  Substance and Sexual Activity   Alcohol use: Yes    Comment: occassional   Drug use: No   Sexual activity: Yes    Birth control/protection: Post-menopausal  Other Topics Concern   Not on file  Social History Narrative   Not on file   Social Determinants of Health   Financial Resource Strain:    Difficulty of Paying Living Expenses: Not on file  Food Insecurity:    Worried About Charity fundraiser in the Last Year: Not on file   [employer] of Food in the Last Year: Not on file  Transportation Needs:    Lack of Transportation (Medical): Not on file   Lack of Transportation (Non-Medical): Not on file  Physical Activity:    Days of Exercise per Week: Not on file   Minutes of Exercise per Session: Not on file  Stress:    Feeling of Stress : Not on file  Social Connections:    Frequency of Communication with Friends and Family: Not on file   Frequency of Social Gatherings with Friends and Family: Not on file   Attends Religious Services: Not on file   Active Member of Clubs or Organizations: Not on file   Attends Club or  Organization Meetings: Not on file   Marital Status: Not on file  Intimate Partner Violence:    Fear of Current or Ex-Partner: Not on file   Emotionally Abused: Not on file   Physically Abused: Not on file   Sexually Abused: Not on file    ROS Review of Systems  Constitutional: Negative for activity change, chills, fatigue, fever and unexpected weight change.  HENT: Negative for dental problem, ear pain, hearing loss, sore throat, trouble swallowing and voice change.   Eyes: Negative for visual disturbance.  Respiratory: Negative for shortness of breath and wheezing.   Cardiovascular: Negative for chest pain, palpitations and leg swelling.  Gastrointestinal: Negative for abdominal pain, blood in stool, diarrhea, nausea and vomiting.  Endocrine: Negative for polydipsia, polyphagia and  polyuria.  Genitourinary: Negative for difficulty urinating, vaginal bleeding and vaginal pain.  Musculoskeletal: Negative for arthralgias, gait problem and myalgias.  Skin: Negative for rash.  Neurological: Negative for dizziness, syncope, speech difficulty, weakness, light-headedness, numbness and headaches.  Psychiatric/Behavioral: Negative for dysphoric mood, self-injury and suicidal ideas. The patient is not nervous/anxious.     Objective:   Today's Vitals: BP 140/78    Pulse 71    Temp (!) 96.9 F (36.1 C) (Temporal)    Ht _0  (1.626 m)    Wt 192 lb 6 oz (87.3 kg)    BMI 33.02 kg/m   Physical Exam Vitals and nursing note reviewed.  Constitutional:      General: She is not in acute distress.    Appearance: Normal appearance. She is not ill-appearing.  HENT:     Head: Normocephalic and atraumatic.     Right Ear: Tympanic membrane, ear canal and external ear normal.     Left Ear: Tympanic membrane, ear canal and external ear normal.     Mouth/Throat:     Mouth: Mucous membranes are moist.     Pharynx: Oropharynx is clear.  Eyes:     Extraocular Movements: Extraocular movements intact.     Conjunctiva/sclera: Conjunctivae normal.     Pupils: Pupils are equal, round, and reactive to light.  Cardiovascular:     Rate and Rhythm: Normal rate and regular rhythm.     Heart sounds: Normal heart sounds. No murmur heard.   Pulmonary:     Effort: Pulmonary effort is normal. No respiratory distress.     Breath sounds: Normal breath sounds.  Abdominal:     General: Bowel sounds are normal. There is no distension.     Palpations: Abdomen is soft. There is no mass.     Tenderness: There is no abdominal tenderness. There is no guarding.  Musculoskeletal:        General: Normal range of motion.     Cervical back: Normal range of motion and neck supple. No tenderness.     Right lower leg: No edema.     Left lower leg: No edema.  Lymphadenopathy:     Cervical: No cervical adenopathy.   Skin:    General: Skin is warm and dry.     Capillary Refill: Capillary refill takes less than 2 seconds.  Neurological:     General: No focal deficit present.     Mental Status: She is alert and oriented to person, place, and time.     Motor: No weakness.     Gait: Gait normal.  Psychiatric:        Mood and Affect: Mood normal.        Behavior: Behavior normal.  Office Visit from 02/18/2020 in [facility]  PHQ-2 Total Score 0     GAD 7 : Generalized Anxiety Score 02/18/2020  Nervous, Anxious, on Edge 0  Control/stop worrying 0  Worry too much - different things 0  Trouble relaxing 0  Restless 0  Easily annoyed or irritable 0  Afraid - awful might happen 0  Total GAD 7 Score 0     Assessment & Plan:   Karn was seen today for new patient (initial visit).  Diagnoses and all orders for this visit:  Elevated BP without diagnosis of hypertension BP 140/78 today in office. BP log given. Asymptotic and without previous dx of HTN. Follow up in 2 weeks to discuss home BP reading and determine in antihypertensives are needed. Dash diet, low salt diet, exercise discussed. Labs pending.  -     CBC with Differential/Platelet -     CMP14+EGFR -     Lipid panel -     Thyroid Panel With TSH  Encounter for medical screening examination -     CBC with Differential/Platelet -     Urinalysis  BMI 33.0-33.9,adult Discussed dash diet, exercise. Labs pending. Follow up in 6 months.  -     CMP14+EGFR -     Lipid panel -     Thyroid Panel With TSH  Colon cancer screening -     Cologuard  Need for immunization against influenza Flu vaccine today in office.  -     Flu Vaccine QUAD 36+ mos IM  Tobacco abuse Tobacco abuse counseling Discussed that Chantix is currently on back order. Patient has failed Wellbutrin in the past. Discussed nicotine patches, patient will purchase OTC.   Follow-up: 2 weeks for BP   The patient indicates understanding of  these issues and agrees with the plan.   Gwenlyn Perking, FNP

## 2020-02-19 ENCOUNTER — Other Ambulatory Visit: Payer: Self-pay | Admitting: Family Medicine

## 2020-02-19 DIAGNOSIS — E782 Mixed hyperlipidemia: Secondary | ICD-10-CM

## 2020-02-19 LAB — CBC WITH DIFFERENTIAL/PLATELET
Basophils Absolute: 0.1 10*3/uL (ref 0.0–0.2)
Basos: 1 %
EOS (ABSOLUTE): 0.1 10*3/uL (ref 0.0–0.4)
Hematocrit: 40.7 % (ref 34.0–46.6)
Hemoglobin: 13.8 g/dL (ref 11.1–15.9)
Immature Grans (Abs): 0 10*3/uL (ref 0.0–0.1)
Immature Granulocytes: 0 %
Lymphs: 26 %
MCH: 31.5 pg (ref 26.6–33.0)
MCHC: 33.9 g/dL (ref 31.5–35.7)
MCV: 93 fL (ref 79–97)
Monocytes: 6 %
Neutrophils Absolute: 5.8 10*3/uL (ref 1.4–7.0)
Neutrophils: 65 %
Platelets: 210 10*3/uL (ref 150–450)
RDW: 12.5 % (ref 11.7–15.4)
WBC: 8.8 10*3/uL (ref 3.4–10.8)

## 2020-02-19 LAB — CMP14+EGFR
ALT: 12 IU/L (ref 0–32)
AST: 14 IU/L (ref 0–40)
Albumin/Globulin Ratio: 1.8 (ref 1.2–2.2)
Albumin: 4.4 g/dL (ref 3.8–4.8)
BUN/Creatinine Ratio: 12 (ref 12–28)
BUN: 10 mg/dL (ref 8–27)
Bilirubin Total: 0.7 mg/dL (ref 0.0–1.2)
CO2: 23 mmol/L (ref 20–29)
Calcium: 9.2 mg/dL (ref 8.7–10.3)
Creatinine, Ser: 0.81 mg/dL (ref 0.57–1.00)
GFR calc Af Amer: 89 mL/min/{1.73_m2} (ref >59)
GFR calc non Af Amer: 78 mL/min/{1.73_m2} (ref >59)
Globulin, Total: 2.4 g/dL (ref 1.5–4.5)
Glucose: 94 mg/dL (ref 65–99)
Sodium: 139 mmol/L (ref 134–144)
Total Protein: 6.8 g/dL (ref 6.0–8.5)

## 2020-02-19 LAB — LIPID PANEL
Chol/HDL Ratio: 3.5 ratio (ref 0.0–4.4)
Cholesterol, Total: 232 mg/dL — ABNORMAL HIGH (ref 100–199)
HDL: 67 mg/dL (ref >39)
Triglycerides: 100 mg/dL (ref 0–149)
VLDL Cholesterol Cal: 17 mg/dL (ref 5–40)

## 2020-02-19 LAB — THYROID PANEL WITH TSH
Free Thyroxine Index: 1.3 (ref 1.2–4.9)
T3 Uptake Ratio: 21 % — ABNORMAL LOW (ref 24–39)

## 2020-02-19 MED ORDER — ATORVASTATIN CALCIUM 10 MG PO TABS: 10.0000 mg | ORAL_TABLET | Freq: Every day | ORAL | 3 refills | Status: DC

## 2020-03-03 ENCOUNTER — Encounter: Payer: Self-pay | Admitting: Family Medicine

## 2020-03-03 ENCOUNTER — Ambulatory Visit (INDEPENDENT_AMBULATORY_CARE_PROVIDER_SITE_OTHER): Payer: Commercial Managed Care - PPO | Admitting: Family Medicine

## 2020-03-03 DIAGNOSIS — E782 Mixed hyperlipidemia: Secondary | ICD-10-CM

## 2020-03-03 DIAGNOSIS — R030 Elevated blood-pressure reading, without diagnosis of hypertension: Secondary | ICD-10-CM

## 2020-03-03 DIAGNOSIS — E785 Hyperlipidemia, unspecified: Secondary | ICD-10-CM | POA: Insufficient documentation

## 2020-03-03 HISTORY — DX: Hyperlipidemia, unspecified: E78.5

## 2020-03-03 NOTE — Progress Notes (Signed)
° °  Virtual Visit via telephone Note Due to COVID-19 pandemic this visit was conducted virtually. This visit type was conducted due to national recommendations for restrictions regarding the COVID-19 Pandemic (e.g. social distancing, sheltering in place) in an effort to limit this patient's exposure and mitigate transmission in our community. All issues noted in this document were discussed and addressed.  A physical exam was not performed with this format.  I connected with Catherine Hudson on 03/03/20 at [phone] by telephone and verified that I am speaking with the correct person using two identifiers. Catherine Hudson is currently located at home and no one is currently with her during the visit. The provider, Gabriel Earing, FNP is located in their office at time of visit.  I discussed the limitations, risks, security and privacy concerns of performing an evaluation and management service by telephone and the availability of in person appointments. I also discussed with the patient that there may be a patient responsible charge related to this service. The patient expressed understanding and agreed to proceed.   History and Present Illness:  HPI  Catherine Hudson has been checking her BP daily at home since her last visit. Over the last week her BP has been in the 120s/80s. It was 124/80 this morning. She has made improvement in her diet that include decreasing fat and salt intake and increasing fruits. She has been exercising more by walking and doing yard work. She has also started on Lipitor since her last visit. She has been tolerating this well without side effects. She denies chest pain, shortness of breath, headaches, changes in vision, or edema.    ROS Per HPI.   Observations/Objective: Alert and oriented x3. She is able to speak in complete sentences without difficulty.  Assessment and Plan: Catherine Hudson was seen today for blood pressure check.  Diagnoses and all orders for this visit:  Elevated BP  without diagnosis of hypertension BP appears well controlled at home. Continue lifestyle changes. Discussed goal BP of 130/80. Follow up in 3-6 months.  Mixed hyperlipidemia Continue Lipitor and lifestyles changes. Follow up in 3 months to repeat lipid panel.    Follow Up Instructions: Follow up 3 months for chronic conditions.     I discussed the assessment and treatment plan with the patient. The patient was provided an opportunity to ask questions and all were answered. The patient agreed with the plan and demonstrated an understanding of the instructions.   The patient was advised to call back or seek an in-person evaluation if the symptoms worsen or if the condition fails to improve as anticipated.  The above assessment and management plan was discussed with the patient. The patient verbalized understanding of and has agreed to the management plan. Patient is aware to call the clinic if symptoms persist or worsen. Patient is aware when to return to the clinic for a follow-up visit. Patient educated on when it is appropriate to go to the emergency department.   Time call ended: 0950  I provided 15 minutes of non-face-to-face time during this encounter.    Gabriel Earing, FNP

## 2021-03-19 ENCOUNTER — Other Ambulatory Visit: Payer: Self-pay

## 2021-03-19 ENCOUNTER — Ambulatory Visit (INDEPENDENT_AMBULATORY_CARE_PROVIDER_SITE_OTHER): Payer: 59 | Admitting: Family Medicine

## 2021-03-19 DIAGNOSIS — Z23 Encounter for immunization: Secondary | ICD-10-CM | POA: Diagnosis not present

## 2021-03-25 ENCOUNTER — Encounter: Payer: Self-pay | Admitting: Family Medicine

## 2021-03-25 ENCOUNTER — Ambulatory Visit: Payer: 59 | Admitting: Family Medicine

## 2021-03-25 ENCOUNTER — Other Ambulatory Visit: Payer: Self-pay

## 2021-03-25 VITALS — BP 137/84 | HR 80 | Temp 97.9°F | Resp 20 | Ht 64.0 in | Wt 208.0 lb

## 2021-03-25 DIAGNOSIS — I10 Essential (primary) hypertension: Secondary | ICD-10-CM

## 2021-03-25 DIAGNOSIS — E782 Mixed hyperlipidemia: Secondary | ICD-10-CM | POA: Diagnosis not present

## 2021-03-25 NOTE — Progress Notes (Signed)
Established Patient Office Visit  Subjective:  Patient ID: Catherine Hudson, female    DOB: October 19, 1955  Age: 65 y.o. MRN: [id number]  CC:  Chief Complaint  Patient presents with   Hypertension    Check up     HPI Catherine Hudson presents for chronic follow up.   HTN Current Medications - none Pertinent ROS:  Headache - No Fatigue - No Visual Disturbances - No Chest pain - No Dyspnea - No Palpitations - No LE edema - No They report good compliance with medications and can restate their regimen by memory. No medication side effects.  Family, social, and smoking history reviewed.   BP Readings from Last 3 Encounters:  03/25/21 137/84  02/18/20 140/78  02/08/16 126/85   CMP Latest Ref Rng & Units 02/18/2020 06/22/2015 03/31/2015  Glucose 65 - 99 mg/dL 94 104(H) 96  BUN 8 - 27 mg/dL 10 15 18   Creatinine 0.57 - 1.00 mg/dL 0.81 0.84 0.71  Sodium 134 - 144 mmol/L 139 141 138  Potassium 3.5 - 5.2 mmol/L 4.5 5.2 4.4  Chloride 96 - 106 mmol/L 104 103 110  CO2 20 - 29 mmol/L 23 23 22   Calcium 8.7 - 10.3 mg/dL 9.2 9.3 9.2  Total Protein 6.0 - 8.5 g/dL 6.8 6.9 -  Total Bilirubin 0.0 - 1.2 mg/dL 0.7 1.2 -  Alkaline Phos 44 - 121 IU/L 58 63 -  AST 0 - 40 IU/L 14 16 -  ALT 0 - 32 IU/L 12 15 -      Past Medical History:  Diagnosis Date   Allergy    Hyperlipidemia 03/03/2020   Medical history non-contributory    PONV (postoperative nausea and vomiting)     Past Surgical History:  Procedure Laterality Date   CATARACT EXTRACTION W/PHACO Right 04/06/2015   Procedure: CATARACT EXTRACTION PHACO AND INTRAOCULAR LENS PLACEMENT ([place]);  Surgeon: Tonny Branch, MD;  Location: [facility];  Service: Ophthalmology;  Laterality: Right;  CDE:8.27   CHOLECYSTECTOMY      Family History  Problem Relation Age of Onset   Hypertension Mother    Diabetes Father    Hypertension Father    Stroke Father     Social History   Socioeconomic History   Marital status: Married    Spouse name: Shanon Brow    Number of children: 0   Years of education: 11   Highest education level: 11th grade  Occupational History    Employer: [employer]  Tobacco Use   Smoking status: Every Day    Packs/day: 0.50    Years: 30.00    Pack years: 15.00    Types: Cigarettes   Smokeless tobacco: Never  Vaping Use   Vaping Use: Never used  Substance and Sexual Activity   Alcohol use: Yes    Comment: occassional   Drug use: No   Sexual activity: Yes    Birth control/protection: Post-menopausal  Other Topics Concern   Not on file  Social History Narrative   Not on file   Social Determinants of Health   Financial Resource Strain: Not on file  Food Insecurity: Not on file  Transportation Needs: Not on file  Physical Activity: Not on file  Stress: Not on file  Social Connections: Not on file  Intimate Partner Violence: Not on file    Outpatient Medications Prior to Visit  Medication Sig Dispense Refill   aspirin EC 81 MG tablet Take 81 mg by mouth daily. Swallow whole.     cetirizine (  ZYRTEC) 10 MG tablet Take 10 mg by mouth daily as needed for allergies.     atorvastatin (LIPITOR) 10 MG tablet Take 1 tablet (10 mg total) by mouth daily. 90 tablet 3   No facility-administered medications prior to visit.    Allergies  Allergen Reactions   Other Hives and Rash    Steroid cream    ROS Review of Systems As per HPI.   Objective:    Physical Exam Vitals and nursing note reviewed.  Constitutional:      General: She is not in acute distress.    Appearance: She is not ill-appearing, toxic-appearing or diaphoretic.  Eyes:     Extraocular Movements: Extraocular movements intact.     Conjunctiva/sclera: Conjunctivae normal.     Pupils: Pupils are equal, round, and reactive to light.  Neck:     Vascular: No carotid bruit.  Cardiovascular:     Rate and Rhythm: Normal rate and regular rhythm.     Heart sounds: Normal heart sounds. No murmur heard. Pulmonary:     Effort: Pulmonary  effort is normal. No respiratory distress.     Breath sounds: Normal breath sounds.  Abdominal:     General: Bowel sounds are normal. There is no distension.     Palpations: Abdomen is soft.     Tenderness: There is no abdominal tenderness. There is no guarding or rebound.  Musculoskeletal:     Cervical back: Neck supple. No tenderness.     Right lower leg: No edema.     Left lower leg: No edema.  Skin:    General: Skin is warm and dry.  Neurological:     General: No focal deficit present.     Mental Status: She is alert and oriented to person, place, and time.  Psychiatric:        Mood and Affect: Mood normal.        Behavior: Behavior normal.    BP 137/84   Pulse 80   Temp 97.9 F (36.6 C)   Resp 20   Ht 5' 4"  (1.626 m)   Wt 208 lb (94.3 kg)   SpO2 98%   BMI 35.70 kg/m  Wt Readings from Last 3 Encounters:  03/25/21 208 lb (94.3 kg)  02/18/20 192 lb 6 oz (87.3 kg)  02/08/16 199 lb (90.3 kg)     Health Maintenance Due  Topic Date Due   COLONOSCOPY (Pts 45-72yr Insurance coverage will need to be confirmed)  Never done   PAP SMEAR-Modifier  07/12/2016   MAMMOGRAM  07/08/2017    There are no preventive care reminders to display for this patient.  Lab Results  Component Value Date   TSH 2.460 02/18/2020   Lab Results  Component Value Date   WBC 8.8 02/18/2020   HGB 13.8 02/18/2020   HCT 40.7 02/18/2020   MCV 93 02/18/2020   PLT 210 02/18/2020   Lab Results  Component Value Date   NA 139 02/18/2020   K 4.5 02/18/2020   CO2 23 02/18/2020   GLUCOSE 94 02/18/2020   BUN 10 02/18/2020   CREATININE 0.81 02/18/2020   BILITOT 0.7 02/18/2020   ALKPHOS 58 02/18/2020   AST 14 02/18/2020   ALT 12 02/18/2020   PROT 6.8 02/18/2020   ALBUMIN 4.4 02/18/2020   CALCIUM 9.2 02/18/2020   ANIONGAP 6 03/31/2015   Lab Results  Component Value Date   CHOL 232 (H) 02/18/2020   Lab Results  Component Value Date   HDL 67  02/18/2020   Lab Results  Component Value  Date   LDLCALC 148 (H) 02/18/2020   Lab Results  Component Value Date   TRIG 100 02/18/2020   Lab Results  Component Value Date   CHOLHDL 3.5 02/18/2020   Lab Results  Component Value Date   HGBA1C 5.5 06/24/2015      Assessment & Plan:   Angelis was seen today for hypertension.  Diagnoses and all orders for this visit:  Primary hypertension Well controlled without medication. Labs pending.  -     CBC with Differential/Platelet -     CMP14+EGFR -     TSH -     Lipid panel  Mixed hyperlipidemia Not on statin. Labs pending. Diet and exercise. Labs pending. -     CBC with Differential/Platelet -     CMP14+EGFR -     Lipid panel  Morbid obesity (HCC) BMI 35 with HTN and HLD. Labs pending. Diet and exercise.  -     CBC with Differential/Platelet -     CMP14+EGFR -     TSH -     Lipid panel  Follow-up: Return in about 6 months (around 09/22/2021) for chronic follow up with pap.  The patient indicates understanding of these issues and agrees with the plan.    Gwenlyn Perking, FNP

## 2021-03-25 NOTE — Patient Instructions (Signed)

## 2021-03-26 ENCOUNTER — Other Ambulatory Visit: Payer: Self-pay | Admitting: Family Medicine

## 2021-03-26 DIAGNOSIS — E7800 Pure hypercholesterolemia, unspecified: Secondary | ICD-10-CM

## 2021-03-26 LAB — CMP14+EGFR
ALT: 13 IU/L (ref 0–32)
AST: 16 IU/L (ref 0–40)
Albumin/Globulin Ratio: 1.7 (ref 1.2–2.2)
Albumin: 4.4 g/dL (ref 3.8–4.8)
Alkaline Phosphatase: 61 IU/L (ref 44–121)
BUN/Creatinine Ratio: 16 (ref 12–28)
BUN: 15 mg/dL (ref 8–27)
Bilirubin Total: 0.7 mg/dL (ref 0.0–1.2)
CO2: 22 mmol/L (ref 20–29)
Calcium: 9.4 mg/dL (ref 8.7–10.3)
Chloride: 102 mmol/L (ref 96–106)
Creatinine, Ser: 0.93 mg/dL (ref 0.57–1.00)
Globulin, Total: 2.6 g/dL (ref 1.5–4.5)
Glucose: 96 mg/dL (ref 70–99)
Potassium: 5 mmol/L (ref 3.5–5.2)
Sodium: 137 mmol/L (ref 134–144)
Total Protein: 7 g/dL (ref 6.0–8.5)
eGFR: 69 mL/min/{1.73_m2} (ref >59)

## 2021-03-26 LAB — CBC WITH DIFFERENTIAL/PLATELET
Basophils Absolute: 0.1 10*3/uL (ref 0.0–0.2)
Basos: 1 %
EOS (ABSOLUTE): 0.1 10*3/uL (ref 0.0–0.4)
Eos: 1 %
Hematocrit: 42.4 % (ref 34.0–46.6)
Hemoglobin: 13.9 g/dL (ref 11.1–15.9)
Immature Grans (Abs): 0 10*3/uL (ref 0.0–0.1)
Immature Granulocytes: 0 %
Lymphocytes Absolute: 1.8 10*3/uL (ref 0.7–3.1)
Lymphs: 27 %
MCH: 30.3 pg (ref 26.6–33.0)
MCHC: 32.8 g/dL (ref 31.5–35.7)
MCV: 92 fL (ref 79–97)
Monocytes Absolute: 0.6 10*3/uL (ref 0.1–0.9)
Monocytes: 8 %
Neutrophils Absolute: 4.4 10*3/uL (ref 1.4–7.0)
Neutrophils: 63 %
Platelets: 225 10*3/uL (ref 150–450)
RBC: 4.59 x10E6/uL (ref 3.77–5.28)
RDW: 12.5 % (ref 11.7–15.4)
WBC: 6.9 10*3/uL (ref 3.4–10.8)

## 2021-03-26 LAB — LIPID PANEL
Chol/HDL Ratio: 3.8 ratio (ref 0.0–4.4)
Cholesterol, Total: 248 mg/dL — ABNORMAL HIGH (ref 100–199)
HDL: 65 mg/dL (ref >39)
LDL Chol Calc (NIH): 162 mg/dL — ABNORMAL HIGH (ref 0–99)
Triglycerides: 119 mg/dL (ref 0–149)
VLDL Cholesterol Cal: 21 mg/dL (ref 5–40)

## 2021-03-26 LAB — TSH: TSH: 3.48 u[IU]/mL (ref 0.450–4.500)

## 2021-03-26 MED ORDER — ATORVASTATIN CALCIUM 20 MG PO TABS: 20.0000 mg | ORAL_TABLET | Freq: Every day | ORAL | 3 refills | Status: DC

## 2021-04-07 ENCOUNTER — Other Ambulatory Visit: Payer: Self-pay | Admitting: *Deleted

## 2021-04-07 DIAGNOSIS — Z1211 Encounter for screening for malignant neoplasm of colon: Secondary | ICD-10-CM

## 2021-04-18 LAB — COLOGUARD: COLOGUARD: NEGATIVE

## 2021-07-15 DIAGNOSIS — H25812 Combined forms of age-related cataract, left eye: Secondary | ICD-10-CM | POA: Diagnosis not present

## 2021-07-15 DIAGNOSIS — Z961 Presence of intraocular lens: Secondary | ICD-10-CM | POA: Diagnosis not present

## 2021-07-26 DIAGNOSIS — H25012 Cortical age-related cataract, left eye: Secondary | ICD-10-CM | POA: Diagnosis not present

## 2021-07-26 DIAGNOSIS — H25042 Posterior subcapsular polar age-related cataract, left eye: Secondary | ICD-10-CM | POA: Diagnosis not present

## 2021-07-26 DIAGNOSIS — H2512 Age-related nuclear cataract, left eye: Secondary | ICD-10-CM | POA: Diagnosis not present

## 2021-09-16 DIAGNOSIS — H25012 Cortical age-related cataract, left eye: Secondary | ICD-10-CM | POA: Diagnosis not present

## 2021-09-16 DIAGNOSIS — H25042 Posterior subcapsular polar age-related cataract, left eye: Secondary | ICD-10-CM | POA: Diagnosis not present

## 2021-09-16 DIAGNOSIS — H2512 Age-related nuclear cataract, left eye: Secondary | ICD-10-CM | POA: Diagnosis not present

## 2021-09-20 ENCOUNTER — Ambulatory Visit (INDEPENDENT_AMBULATORY_CARE_PROVIDER_SITE_OTHER): Payer: Medicare HMO

## 2021-09-20 ENCOUNTER — Other Ambulatory Visit (HOSPITAL_COMMUNITY)
Admission: RE | Admit: 2021-09-20 | Discharge: 2021-09-20 | Disposition: A | Discharge status: Home / Self Care | Payer: Medicare HMO | Source: Ambulatory Visit | Attending: Family Medicine | Admitting: Family Medicine

## 2021-09-20 ENCOUNTER — Encounter: Payer: Self-pay | Admitting: Family Medicine

## 2021-09-20 ENCOUNTER — Ambulatory Visit (INDEPENDENT_AMBULATORY_CARE_PROVIDER_SITE_OTHER): Payer: Medicare HMO | Admitting: Family Medicine

## 2021-09-20 VITALS — BP 136/75 | HR 68 | Temp 97.8°F | Ht 64.0 in | Wt 204.0 lb

## 2021-09-20 DIAGNOSIS — E7800 Pure hypercholesterolemia, unspecified: Secondary | ICD-10-CM

## 2021-09-20 DIAGNOSIS — Z124 Encounter for screening for malignant neoplasm of cervix: Secondary | ICD-10-CM | POA: Insufficient documentation

## 2021-09-20 DIAGNOSIS — Z23 Encounter for immunization: Secondary | ICD-10-CM

## 2021-09-20 DIAGNOSIS — Z1231 Encounter for screening mammogram for malignant neoplasm of breast: Secondary | ICD-10-CM | POA: Diagnosis not present

## 2021-09-20 DIAGNOSIS — Z716 Tobacco abuse counseling: Secondary | ICD-10-CM | POA: Diagnosis not present

## 2021-09-20 DIAGNOSIS — Z780 Asymptomatic menopausal state: Secondary | ICD-10-CM

## 2021-09-20 DIAGNOSIS — Z720 Tobacco use: Secondary | ICD-10-CM

## 2021-09-20 DIAGNOSIS — Z0000 Encounter for general adult medical examination without abnormal findings: Secondary | ICD-10-CM

## 2021-09-20 DIAGNOSIS — Z0001 Encounter for general adult medical examination with abnormal findings: Secondary | ICD-10-CM

## 2021-09-20 NOTE — Patient Instructions (Addendum)
Health Maintenance, Female ?Adopting a healthy lifestyle and getting preventive care are important in promoting health and wellness. Ask your health care provider about: ?The right schedule for you to have regular tests and exams. ?Things you can do on your own to prevent diseases and keep yourself healthy. ?What should I know about diet, weight, and exercise? ?Eat a healthy diet ? ?Eat a diet that includes plenty of vegetables, fruits, low-fat dairy products, and lean protein. ?Do not eat a lot of foods that are high in solid fats, added sugars, or sodium. ?Maintain a healthy weight ?Body mass index (BMI) is used to identify weight problems. It estimates body fat based on height and weight. Your health care provider can help determine your BMI and help you achieve or maintain a healthy weight. ?Get regular exercise ?Get regular exercise. This is one of the most important things you can do for your health. Most adults should: ?Exercise for at least 150 minutes each week. The exercise should increase your heart rate and make you sweat (moderate-intensity exercise). ?Do strengthening exercises at least twice a week. This is in addition to the moderate-intensity exercise. ?Spend less time sitting. Even light physical activity can be beneficial. ?Watch cholesterol and blood lipids ?Have your blood tested for lipids and cholesterol at 65 years of age, then have this test every 5 years. ?Have your cholesterol levels checked more often if: ?Your lipid or cholesterol levels are high. ?You are older than 65 years of age. ?You are at high risk for heart disease. ?What should I know about cancer screening? ?Depending on your health history and family history, you may need to have cancer screening at various ages. This may include screening for: ?Breast cancer. ?Cervical cancer. ?Colorectal cancer. ?Skin cancer. ?Lung cancer. ?What should I know about heart disease, diabetes, and high blood pressure? ?Blood pressure and heart  disease ?High blood pressure causes heart disease and increases the risk of stroke. This is more likely to develop in people who have high blood pressure readings or are overweight. ?Have your blood pressure checked: ?Every 3-5 years if you are 19-36 years of age. ?Every year if you are 48 years old or older. ?Diabetes ?Have regular diabetes screenings. This checks your fasting blood sugar level. Have the screening done: ?Once every three years after age 18 if you are at a normal weight and have a low risk for diabetes. ?More often and at a younger age if you are overweight or have a high risk for diabetes. ?What should I know about preventing infection? ?Hepatitis B ?If you have a higher risk for hepatitis B, you should be screened for this virus. Talk with your health care provider to find out if you are at risk for hepatitis B infection. ?Hepatitis C ?Testing is recommended for: ?Everyone born from [birth year] through 1964. ?Anyone with known risk factors for hepatitis C. ?Sexually transmitted infections (STIs) ?Get screened for STIs, including gonorrhea and chlamydia, if: ?You are sexually active and are younger than 65 years of age. ?You are older than 65 years of age and your health care provider tells you that you are at risk for this type of infection. ?Your sexual activity has changed since you were last screened, and you are at increased risk for chlamydia or gonorrhea. Ask your health care provider if you are at risk. ?Ask your health care provider about whether you are at high risk for HIV. Your health care provider may recommend a prescription medicine to help prevent HIV  infection. If you choose to take medicine to prevent HIV, you should first get tested for HIV. You should then be tested every 3 months for as long as you are taking the medicine. ?Pregnancy ?If you are about to stop having your period (premenopausal) and you may become pregnant, seek counseling before you get pregnant. ?Take 400 to 800  micrograms (mcg) of folic acid every day if you become pregnant. ?Ask for birth control (contraception) if you want to prevent pregnancy. ?Osteoporosis and menopause ?Osteoporosis is a disease in which the bones lose minerals and strength with aging. This can result in bone fractures. If you are 25 years old or older, or if you are at risk for osteoporosis and fractures, ask your health care provider if you should: ?Be screened for bone loss. ?Take a calcium or vitamin D supplement to lower your risk of fractures. ?Be given hormone replacement therapy (HRT) to treat symptoms of menopause. ?Follow these instructions at home: ?Alcohol use ?Do not drink alcohol if: ?Your health care provider tells you not to drink. ?You are pregnant, may be pregnant, or are planning to become pregnant. ?If you drink alcohol: ?Limit how much you have to: ?0-1 drink a day. ?Know how much alcohol is in your drink. In the [place] one drink equals one 12 oz bottle of beer (355 mL), one 5 oz glass of wine (148 mL), or one 1? oz glass of hard liquor (44 mL). ?Lifestyle ?Do not use any products that contain nicotine or tobacco. These products include cigarettes, chewing tobacco, and vaping devices, such as e-cigarettes. If you need help quitting, ask your health care provider. ?Do not use street drugs. ?Do not share needles. ?Ask your health care provider for help if you need support or information about quitting drugs. ?General instructions ?Schedule regular health, dental, and eye exams. ?Stay current with your vaccines. ?Tell your health care provider if: ?You often feel depressed. ?You have ever been abused or do not feel safe at home. ?Summary ?Adopting a healthy lifestyle and getting preventive care are important in promoting health and wellness. ?Follow your health care provider's instructions about healthy diet, exercising, and getting tested or screened for diseases. ?Follow your health care provider's instructions on monitoring your  cholesterol and blood pressure. ?This information is not intended to replace advice given to you by your health care provider. Make sure you discuss any questions you have with your health care provider. ?Document Revised: 09/14/2020 Document Reviewed: 09/14/2020 ?Elsevier Patient Education ? 2023 Elsevier Inc. ?Smoking Tobacco Information, Adult ?Smoking tobacco can be harmful to your health. Tobacco contains a toxic colorless chemical called nicotine. Nicotine causes changes in your brain that make you want more and more. This is called addiction. This can make it hard to stop smoking once you start. Tobacco also has other toxic chemicals that can hurt your body and raise your risk of many cancers. ?Menthol or "lite" tobacco or cigarette brands are not safer than regular brands. ?How can smoking tobacco affect me? ?Smoking tobacco puts you at risk for: ?Cancer. Smoking is most commonly associated with lung cancer, but can also lead to cancer in other parts of the body. ?Chronic obstructive pulmonary disease (COPD). This is a long-term lung condition that makes it hard to breathe. It also gets worse over time. ?High blood pressure (hypertension), heart disease, stroke, heart attack, and lung infections, such as pneumonia. ?Cataracts. This is when the lenses in the eyes become clouded. ?Digestive problems. This may include peptic ulcers,  heartburn, and gastroesophageal reflux disease (GERD). ?Oral health problems, such as gum disease, mouth sores, and tooth loss. ?Loss of taste and smell. ?Smoking also affects how you look and smell. Smoking may cause: ?Wrinkles. ?Yellow or stained teeth, fingers, and fingernails. ?Bad breath. ?Bad-smelling clothes and hair. ?Smoking tobacco can also affect your social life, because: ?It may be challenging to find places to smoke when away from home. Many workplaces, [employer], hotels, and public places are tobacco-free. ?Smoking is expensive. This is due to the cost of tobacco  and the long-term costs of treating health problems from smoking. ?Secondhand smoke may affect those around you. Secondhand smoke can cause lung cancer, breathing problems, and heart disease. Children of smoker

## 2021-09-20 NOTE — Progress Notes (Signed)
? ?Catherine Hudson is a 66 y.o. female presents to office today for annual physical exam examination.   ? ?Concerns today include: ?1. None ? ?Occupation: retired, Marital status: married, Substance use: tobacco ?Diet: regular, Exercise: no exercise, active ?Last colonoscopy: cologuard 2022 ?Last mammogram: 2017 ?Last pap smear: 2015 ? ?Past Medical History:  ?Diagnosis Date  ? Allergy   ? Hyperlipidemia 03/03/2020  ? Medical history non-contributory   ? PONV (postoperative nausea and vomiting)   ? ?Social History  ? ?Socioeconomic History  ? Marital status: Married  ?  Spouse name: Shanon Brow  ? Number of children: 0  ? Years of education: 97  ? Highest education level: 11th grade  ?Occupational History  ?  Employer: [employer]  ?Tobacco Use  ? Smoking status: Every Day  ?  Packs/day: 0.50  ?  Years: 30.00  ?  Pack years: 15.00  ?  Types: Cigarettes  ? Smokeless tobacco: Never  ?Vaping Use  ? Vaping Use: Never used  ?Substance and Sexual Activity  ? Alcohol use: Yes  ?  Comment: occassional  ? Drug use: No  ? Sexual activity: Yes  ?  Birth control/protection: Post-menopausal  ?Other Topics Concern  ? Not on file  ?Social History Narrative  ? Not on file  ? ?Social Determinants of Health  ? ?Financial Resource Strain: Not on file  ?Food Insecurity: Not on file  ?Transportation Needs: Not on file  ?Physical Activity: Not on file  ?Stress: Not on file  ?Social Connections: Not on file  ?Intimate Partner Violence: Not on file  ? ?Past Surgical History:  ?Procedure Laterality Date  ? CATARACT EXTRACTION W/PHACO Right 04/06/2015  ? Procedure: CATARACT EXTRACTION PHACO AND INTRAOCULAR LENS PLACEMENT ([facility]);  Surgeon: Tonny Branch, MD;  Location: [facility];  Service: Ophthalmology;  Laterality: Right;  CDE:8.27  ? CHOLECYSTECTOMY    ? ?Family History  ?Problem Relation Age of Onset  ? Hypertension Mother   ? Diabetes Father   ? Hypertension Father   ? Stroke Father   ? ? ?Current Outpatient Medications:  ?  aspirin EC 81 MG  tablet, Take 81 mg by mouth daily. Swallow whole., Disp: , Rfl:  ?  atorvastatin (LIPITOR) 20 MG tablet, Take 1 tablet (20 mg total) by mouth daily., Disp: 90 tablet, Rfl: 3 ?  cetirizine (ZYRTEC) 10 MG tablet, Take 10 mg by mouth daily as needed for allergies., Disp: , Rfl:  ?  gatifloxacin (ZYMAXID) 0.5 % SOLN, Place 1 drop into the left eye 4 (four) times daily., Disp: , Rfl:  ?  ketorolac (ACULAR) 0.5 % ophthalmic solution, Place 1 drop into the left eye 4 (four) times daily., Disp: , Rfl:  ?  prednisoLONE acetate (PRED FORTE) 1 % ophthalmic suspension, Place 1 drop into the left eye 4 (four) times daily., Disp: , Rfl:  ? ?Allergies  ?Allergen Reactions  ? Other Hives and Rash  ?  Steroid cream  ?  ? ?ROS: ?Review of Systems ?Pertinent items noted in HPI and remainder of comprehensive ROS otherwise negative.   ? ?Physical exam ?BP 136/75   Pulse 68   Temp 97.8 ?F (36.6 ?C) (Temporal)   Ht $R'5\' 4"'Ky$  (1.626 m)   Wt 204 lb (92.5 kg)   SpO2 98%   BMI 35.02 kg/m?  ?General appearance: alert, cooperative, and no distress ?Head: Normocephalic, without obvious abnormality, atraumatic ?Eyes: conjunctivae/corneas clear. PERRL, EOM's intact.  ?Ears: normal TM's and external ear canals both ears ?Nose: Nares normal.  Septum midline. Mucosa normal. No drainage or sinus tenderness. ?Throat: lips, mucosa, and tongue normal; teeth and gums normal ?Neck: no adenopathy, no carotid bruit, no JVD, supple, symmetrical, trachea midline, and thyroid not enlarged, symmetric, no tenderness/mass/nodules ?Back: symmetric, no curvature. ROM normal. No CVA tenderness. ?Lungs: clear to auscultation bilaterally ?Heart: regular rate and rhythm, S1, S2 normal, no murmur, click, rub or gallop ?Abdomen: soft, non-tender; bowel sounds normal; no masses,  no organomegaly ?Pelvic: cervix normal in appearance, external genitalia normal, no adnexal masses or tenderness, no cervical motion tenderness, rectovaginal septum normal, uterus normal size,  shape, and consistency, and vagina normal without discharge ?Extremities: extremities normal, atraumatic, no cyanosis or edema ?Skin: Skin color, texture, turgor normal. No rashes or lesions ?Lymph nodes: Cervical and supraclavicular nodes normal. ?Neurologic: Alert and oriented X 3, normal strength and tone. Normal symmetric reflexes. Normal coordination and gait  ? ? ?Assessment/ Plan: ?ASHLEY BULTEMA here for annual physical exam.  ? ?Catherine Hudson was seen today for annual exam. ? ?Diagnoses and all orders for this visit: ? ?Routine general medical examination at a health care facility ?Fasting labs pending.  ?-     CBC with Differential/Platelet ?-     CMP14+EGFR ?-     Lipid panel ?-     Thyroid Panel With TSH ? ?Cervical cancer screening ?-     Cytology - PAP ? ?Post-menopausal ?-     DG [facility] DEXA; Future ? ?Encounter for screening mammogram for malignant neoplasm of breast ?-     MM Digital Screening; Future ? ?Tobacco abuse counseling ?Tobacco abuse ?Not ready to quit, she has been cutting back. Discussed risks and cessation support/options. Declined referral for low dose CT scan but reports she will left me know if a few months when she would like to do this.  ? ?Pure hypercholesterolemia ?On lipitor. Labs pending.  ? ?Morbid obesity ([facility]) ?Diet and exercise. Labs pending.  ? ?Prevnar 20 vaccine today in office.  ? ?Counseled on healthy lifestyle choices, including diet (rich in fruits, vegetables and lean meats and low in salt and simple carbohydrates) and exercise (at least 30 minutes of moderate physical activity daily). ? ?Patient to follow up in 1 year for annual exam or sooner if needed. ? ?The above assessment and management plan was discussed with the patient. The patient verbalized understanding of and has agreed to the management plan. Patient is aware to call the clinic if symptoms persist or worsen. Patient is aware when to return to the clinic for a follow-up visit. Patient educated on when it is  appropriate to go to the emergency department.  ? ?Marjorie Smolder, FNP-C ?[facility] ?[address] ?[place], [place] [address] ?[phone] ? ? ? ? ? ?

## 2021-09-21 DIAGNOSIS — M85851 Other specified disorders of bone density and structure, right thigh: Secondary | ICD-10-CM | POA: Diagnosis not present

## 2021-09-21 DIAGNOSIS — Z780 Asymptomatic menopausal state: Secondary | ICD-10-CM | POA: Diagnosis not present

## 2021-09-21 LAB — LIPID PANEL
Chol/HDL Ratio: 2.3 ratio (ref 0.0–4.4)
Cholesterol, Total: 157 mg/dL (ref 100–199)
HDL: 68 mg/dL (ref >39)
LDL Chol Calc (NIH): 73 mg/dL (ref 0–99)
Triglycerides: 85 mg/dL (ref 0–149)
VLDL Cholesterol Cal: 16 mg/dL (ref 5–40)

## 2021-09-21 LAB — CYTOLOGY - PAP
Adequacy: ABSENT
Diagnosis: NEGATIVE

## 2021-09-21 LAB — CMP14+EGFR
ALT: 13 IU/L (ref 0–32)
AST: 18 IU/L (ref 0–40)
Albumin/Globulin Ratio: 1.8 (ref 1.2–2.2)
Albumin: 4.4 g/dL (ref 3.8–4.8)
Alkaline Phosphatase: 63 IU/L (ref 44–121)
BUN/Creatinine Ratio: 14 (ref 12–28)
BUN: 13 mg/dL (ref 8–27)
Bilirubin Total: 0.9 mg/dL (ref 0.0–1.2)
CO2: 23 mmol/L (ref 20–29)
Calcium: 9.5 mg/dL (ref 8.7–10.3)
Chloride: 103 mmol/L (ref 96–106)
Creatinine, Ser: 0.91 mg/dL (ref 0.57–1.00)
Globulin, Total: 2.5 g/dL (ref 1.5–4.5)
Glucose: 90 mg/dL (ref 70–99)
Potassium: 4.7 mmol/L (ref 3.5–5.2)
Sodium: 138 mmol/L (ref 134–144)
Total Protein: 6.9 g/dL (ref 6.0–8.5)
eGFR: 70 mL/min/{1.73_m2} (ref >59)

## 2021-09-21 LAB — CBC WITH DIFFERENTIAL/PLATELET
Basophils Absolute: 0.1 10*3/uL (ref 0.0–0.2)
Basos: 1 %
EOS (ABSOLUTE): 0.1 10*3/uL (ref 0.0–0.4)
Eos: 2 %
Hematocrit: 41.9 % (ref 34.0–46.6)
Hemoglobin: 14.1 g/dL (ref 11.1–15.9)
Immature Grans (Abs): 0 10*3/uL (ref 0.0–0.1)
Immature Granulocytes: 0 %
Lymphocytes Absolute: 3 10*3/uL (ref 0.7–3.1)
Lymphs: 32 %
MCH: 30.7 pg (ref 26.6–33.0)
MCHC: 33.7 g/dL (ref 31.5–35.7)
MCV: 91 fL (ref 79–97)
Monocytes Absolute: 0.5 10*3/uL (ref 0.1–0.9)
Monocytes: 6 %
Neutrophils Absolute: 5.5 10*3/uL (ref 1.4–7.0)
Neutrophils: 59 %
Platelets: 222 10*3/uL (ref 150–450)
RBC: 4.59 x10E6/uL (ref 3.77–5.28)
RDW: 12.6 % (ref 11.7–15.4)
WBC: 9.2 10*3/uL (ref 3.4–10.8)

## 2021-09-21 LAB — THYROID PANEL WITH TSH
Free Thyroxine Index: 1.5 (ref 1.2–4.9)
T3 Uptake Ratio: 23 % — ABNORMAL LOW (ref 24–39)
T4, Total: 6.6 ug/dL (ref 4.5–12.0)
TSH: 3.2 u[IU]/mL (ref 0.450–4.500)

## 2021-09-22 ENCOUNTER — Other Ambulatory Visit: Payer: Self-pay | Admitting: Family Medicine

## 2021-09-22 DIAGNOSIS — M8580 Other specified disorders of bone density and structure, unspecified site: Secondary | ICD-10-CM

## 2021-09-22 MED ORDER — OYSTER SHELL CALCIUM/D3 500-5 MG-MCG PO TABS: 2.0000 | ORAL_TABLET | Freq: Every day | ORAL | 3 refills | Status: DC

## 2021-10-22 DIAGNOSIS — Z1231 Encounter for screening mammogram for malignant neoplasm of breast: Secondary | ICD-10-CM | POA: Diagnosis not present

## 2022-03-02 ENCOUNTER — Telehealth: Payer: Self-pay | Admitting: Family Medicine

## 2022-03-27 ENCOUNTER — Other Ambulatory Visit: Payer: Self-pay | Admitting: Family Medicine

## 2022-03-27 DIAGNOSIS — E7800 Pure hypercholesterolemia, unspecified: Secondary | ICD-10-CM

## 2022-04-14 ENCOUNTER — Ambulatory Visit (INDEPENDENT_AMBULATORY_CARE_PROVIDER_SITE_OTHER): Payer: Medicare HMO

## 2022-04-14 DIAGNOSIS — Z23 Encounter for immunization: Secondary | ICD-10-CM

## 2022-04-26 ENCOUNTER — Other Ambulatory Visit: Payer: Self-pay | Admitting: Family Medicine

## 2022-04-26 DIAGNOSIS — E7800 Pure hypercholesterolemia, unspecified: Secondary | ICD-10-CM

## 2022-05-16 ENCOUNTER — Other Ambulatory Visit: Payer: Self-pay | Admitting: Family Medicine

## 2022-05-16 DIAGNOSIS — E7800 Pure hypercholesterolemia, unspecified: Secondary | ICD-10-CM

## 2022-06-09 ENCOUNTER — Other Ambulatory Visit: Payer: Self-pay | Admitting: Family Medicine

## 2022-06-09 DIAGNOSIS — E7800 Pure hypercholesterolemia, unspecified: Secondary | ICD-10-CM

## 2022-06-29 ENCOUNTER — Encounter: Payer: Self-pay | Admitting: Family Medicine

## 2022-06-29 ENCOUNTER — Ambulatory Visit (INDEPENDENT_AMBULATORY_CARE_PROVIDER_SITE_OTHER): Payer: Medicare HMO | Admitting: Family Medicine

## 2022-06-29 VITALS — BP 124/78 | HR 79 | Temp 97.9°F | Ht 64.0 in | Wt 199.5 lb

## 2022-06-29 DIAGNOSIS — Z720 Tobacco use: Secondary | ICD-10-CM

## 2022-06-29 DIAGNOSIS — M8580 Other specified disorders of bone density and structure, unspecified site: Secondary | ICD-10-CM | POA: Diagnosis not present

## 2022-06-29 DIAGNOSIS — Z6834 Body mass index (BMI) 34.0-34.9, adult: Secondary | ICD-10-CM | POA: Diagnosis not present

## 2022-06-29 DIAGNOSIS — E7800 Pure hypercholesterolemia, unspecified: Secondary | ICD-10-CM | POA: Diagnosis not present

## 2022-06-29 DIAGNOSIS — E6609 Other obesity due to excess calories: Secondary | ICD-10-CM

## 2022-06-29 DIAGNOSIS — R21 Rash and other nonspecific skin eruption: Secondary | ICD-10-CM | POA: Diagnosis not present

## 2022-06-29 MED ORDER — CLOTRIMAZOLE 1 % EX CREA: 1.0000 | TOPICAL_CREAM | Freq: Two times a day (BID) | CUTANEOUS | 0 refills | Status: DC

## 2022-06-29 NOTE — Progress Notes (Signed)
Established Patient Office Visit  Subjective   Patient ID: Catherine Hudson, female    DOB: 05/01/1956  Age: 67 y.o. MRN: [id number]  Chief Complaint  Patient presents with   Medical Management of Chronic Issues   Hyperlipidemia    Hyperlipidemia    HLD On statin. Last LDL well controlled. Reports she does well with her diet for awhile but then falls off. Does not exercise regularly. Denies side effects.   2. Obesity She has lost 5 lbs since her last visit.   3. Tobacco use She is current smoker. She has have a 1 PPD average for over 20 years. She has been cutting back recently to about 1/2 PPD. She is interested in   4. Osteopenia She is taking a MV.   5. Rash She reports a recurrent rash that sometimes occurs on her legs. She uses clotrimazole with good relief. She would like a refill.   Past Medical History:  Diagnosis Date   Allergy    Hyperlipidemia 03/03/2020   Medical history non-contributory    PONV (postoperative nausea and vomiting)       ROS As per HPI.    Objective:     BP 124/78   Pulse 79   Temp 97.9 F (36.6 C) (Temporal)   Ht 5' 4"$  (1.626 m)   Wt 199 lb 8 oz (90.5 kg)   SpO2 95%   BMI 34.24 kg/m  Wt Readings from Last 3 Encounters:  06/29/22 199 lb 8 oz (90.5 kg)  09/20/21 204 lb (92.5 kg)  03/25/21 208 lb (94.3 kg)      Physical Exam Vitals and nursing note reviewed.  Constitutional:      General: She is not in acute distress.    Appearance: She is not ill-appearing, toxic-appearing or diaphoretic.  Cardiovascular:     Rate and Rhythm: Normal rate and regular rhythm.     Heart sounds: Normal heart sounds. No murmur heard. Pulmonary:     Effort: Pulmonary effort is normal.     Breath sounds: Normal breath sounds.  Abdominal:     General: Bowel sounds are normal. There is no distension.     Palpations: Abdomen is soft.     Tenderness: There is no abdominal tenderness. There is no guarding or rebound.  Musculoskeletal:      Cervical back: Neck supple. No rigidity.     Right lower leg: No edema.     Left lower leg: No edema.  Skin:    General: Skin is warm and dry.  Neurological:     General: No focal deficit present.     Mental Status: She is alert and oriented to person, place, and time.  Psychiatric:        Mood and Affect: Mood normal.        Behavior: Behavior normal.        Thought Content: Thought content normal.        Judgment: Judgment normal.      No results found for any visits on 06/29/22.    The 10-year ASCVD risk score (Arnett DK, et al., 2019) is: 7.7%    Assessment & Plan:   Maecyn was seen today for medical management of chronic issues and hyperlipidemia.  Diagnoses and all orders for this visit:  Pure hypercholesterolemia Fasting lipid panel pending. Discussed trial off atorvastatin if well controlled for 3 months with diet.  -     CMP14+EGFR -     CBC with Differential/Platelet -  Lipid panel  Class 1 obesity due to excess calories with serious comorbidity and body mass index (BMI) of 34.0 to 34.9 in adult Down 5 lbs since las visit. Labs pending.  -     CMP14+EGFR -     CBC with Differential/Platelet -     Lipid panel -     Vitamin D, 25-hydroxy  Osteopenia, unspecified location Discussed calcium, vitamin D supplement.   Tobacco use Lung cancer screening discussed and ordered.  -     CT CHEST LUNG CA SCREEN LOW DOSE W/O CM; Future  Rash Refill provided.  -     clotrimazole (LOTRIMIN) 1 % cream; Apply 1 Application topically 2 (two) times daily.  Return in about 6 months (around 12/28/2022) for CPE.   The patient indicates understanding of these issues and agrees with the plan.  Gwenlyn Perking, FNP

## 2022-06-30 ENCOUNTER — Other Ambulatory Visit: Payer: Self-pay | Admitting: Family Medicine

## 2022-06-30 DIAGNOSIS — E559 Vitamin D deficiency, unspecified: Secondary | ICD-10-CM

## 2022-06-30 LAB — CMP14+EGFR
ALT: 15 IU/L (ref 0–32)
AST: 17 IU/L (ref 0–40)
Albumin/Globulin Ratio: 1.8 (ref 1.2–2.2)
Albumin: 4.4 g/dL (ref 3.9–4.9)
Alkaline Phosphatase: 72 IU/L (ref 44–121)
BUN/Creatinine Ratio: 16 (ref 12–28)
BUN: 13 mg/dL (ref 8–27)
Bilirubin Total: 1.3 mg/dL — ABNORMAL HIGH (ref 0.0–1.2)
CO2: 20 mmol/L (ref 20–29)
Calcium: 9.3 mg/dL (ref 8.7–10.3)
Chloride: 104 mmol/L (ref 96–106)
Creatinine, Ser: 0.83 mg/dL (ref 0.57–1.00)
Globulin, Total: 2.5 g/dL (ref 1.5–4.5)
Glucose: 85 mg/dL (ref 70–99)
Potassium: 4.8 mmol/L (ref 3.5–5.2)
Sodium: 140 mmol/L (ref 134–144)
Total Protein: 6.9 g/dL (ref 6.0–8.5)
eGFR: 78 mL/min/{1.73_m2} (ref >59)

## 2022-06-30 LAB — CBC WITH DIFFERENTIAL/PLATELET
Basophils Absolute: 0.1 10*3/uL (ref 0.0–0.2)
Basos: 1 %
EOS (ABSOLUTE): 0.1 10*3/uL (ref 0.0–0.4)
Eos: 1 %
Hematocrit: 40.1 % (ref 34.0–46.6)
Hemoglobin: 13.6 g/dL (ref 11.1–15.9)
Immature Grans (Abs): 0 10*3/uL (ref 0.0–0.1)
Immature Granulocytes: 0 %
Lymphocytes Absolute: 2.8 10*3/uL (ref 0.7–3.1)
Lymphs: 33 %
MCH: 31 pg (ref 26.6–33.0)
MCHC: 33.9 g/dL (ref 31.5–35.7)
MCV: 91 fL (ref 79–97)
Monocytes Absolute: 0.5 10*3/uL (ref 0.1–0.9)
Monocytes: 6 %
Neutrophils Absolute: 5 10*3/uL (ref 1.4–7.0)
Neutrophils: 59 %
Platelets: 225 10*3/uL (ref 150–450)
RBC: 4.39 x10E6/uL (ref 3.77–5.28)
RDW: 12.6 % (ref 11.7–15.4)
WBC: 8.4 10*3/uL (ref 3.4–10.8)

## 2022-06-30 LAB — VITAMIN D 25 HYDROXY (VIT D DEFICIENCY, FRACTURES): Vit D, 25-Hydroxy: 23.3 ng/mL — ABNORMAL LOW (ref 30.0–100.0)

## 2022-06-30 LAB — LIPID PANEL
Chol/HDL Ratio: 2.6 ratio (ref 0.0–4.4)
Cholesterol, Total: 162 mg/dL (ref 100–199)
HDL: 62 mg/dL (ref >39)
LDL Chol Calc (NIH): 84 mg/dL (ref 0–99)
Triglycerides: 85 mg/dL (ref 0–149)
VLDL Cholesterol Cal: 16 mg/dL (ref 5–40)

## 2022-06-30 MED ORDER — VITAMIN D (ERGOCALCIFEROL) 1.25 MG (50000 UNIT) PO CAPS: 50000.0000 [IU] | ORAL_CAPSULE | ORAL | 0 refills | Status: DC

## 2022-06-30 NOTE — Progress Notes (Signed)
R/c

## 2022-07-01 ENCOUNTER — Telehealth: Payer: Self-pay | Admitting: Family Medicine

## 2022-08-08 ENCOUNTER — Telehealth: Payer: Self-pay | Admitting: Family Medicine

## 2022-08-08 NOTE — Telephone Encounter (Signed)
Called patient to schedule Medicare Annual Wellness Visit (AWV). Left message for patient to call back and schedule Medicare Annual Wellness Visit (AWV).  Last date of AWV: due 06/09/2022 awvi per [facility]  Please schedule an appointment at any time with either Laura or Courtney, NHA's. .  If any questions, please contact me at [phone].  Thank you,  Stephanie,  AMB Clinical Support [facility] AWV Program Direct Dial ??[phone]   

## 2022-08-09 ENCOUNTER — Other Ambulatory Visit: Payer: Self-pay | Admitting: Family Medicine

## 2022-08-09 DIAGNOSIS — E7800 Pure hypercholesterolemia, unspecified: Secondary | ICD-10-CM

## 2022-08-29 NOTE — Progress Notes (Signed)
[facility] referral received. Attempted to reach patient but was unable to do so. Detailed VM left asking that the patient return my call. ? ?

## 2022-09-06 ENCOUNTER — Telehealth: Payer: Self-pay | Admitting: Family Medicine

## 2022-09-06 NOTE — Telephone Encounter (Signed)
Contacted Catherine Hudson to schedule their annual wellness visit. Appointment made for 09/20/2022.  Thank you,  Catherine Hudson,  AMB Clinical Support [facility] AWV Program Direct Dial ??[phone]

## 2022-09-06 NOTE — Telephone Encounter (Signed)
Called patient to schedule Medicare Annual Wellness Visit (AWV). Left message for patient to call back and schedule Medicare Annual Wellness Visit (AWV).  Last date of AWV: due 06/09/2022 awvi per [facility]  Please schedule an appointment at any time with Abby, NHA. .  If any questions, please contact me at [phone].  Thank you,  Judeth Cornfield,  AMB Clinical Support [facility] AWV Program Direct Dial ??[phone]

## 2022-09-20 ENCOUNTER — Ambulatory Visit (INDEPENDENT_AMBULATORY_CARE_PROVIDER_SITE_OTHER): Payer: Medicare HMO

## 2022-09-20 VITALS — Ht 64.0 in | Wt 200.0 lb

## 2022-09-20 DIAGNOSIS — Z1231 Encounter for screening mammogram for malignant neoplasm of breast: Secondary | ICD-10-CM

## 2022-09-20 DIAGNOSIS — Z0000 Encounter for general adult medical examination without abnormal findings: Secondary | ICD-10-CM

## 2022-09-20 DIAGNOSIS — Z122 Encounter for screening for malignant neoplasm of respiratory organs: Secondary | ICD-10-CM

## 2022-09-20 NOTE — Progress Notes (Signed)
Subjective:   Catherine Hudson is a 67 y.o. female who presents for an Initial Medicare Annual Wellness Visit. I connected with  Catherine Hudson on 09/20/22 by a audio enabled telemedicine application and verified that I am speaking with the correct person using two identifiers.  Patient Location: Home  Provider Location: Home Office  I discussed the limitations of evaluation and management by telemedicine. The patient expressed understanding and agreed to proceed.  Review of Systems     Cardiac Risk Factors include: advanced age (>31men, >17 women);dyslipidemia     Objective:    Today's Vitals   09/20/22 1449  Weight: 200 lb (90.7 kg)  Height: 5\' 4"  (1.626 m)   Body mass index is 34.33 kg/m.     09/20/2022    2:51 PM 04/06/2015   12:13 PM 03/31/2015   11:04 AM  Advanced Directives  Does Patient Have a Medical Advance Directive? No No No  Would patient like information on creating a medical advance directive? No - Patient declined No - patient declined information No - patient declined information    Current Medications (verified) Outpatient Encounter Medications as of 09/20/2022  Medication Sig   aspirin EC 81 MG tablet Take 81 mg by mouth daily. Swallow whole.   atorvastatin (LIPITOR) 20 MG tablet Take 1 tablet (20 mg total) by mouth daily.   cetirizine (ZYRTEC) 10 MG tablet Take 10 mg by mouth daily as needed for allergies.   clotrimazole (LOTRIMIN) 1 % cream Apply 1 Application topically 2 (two) times daily.   Vitamin D, Ergocalciferol, (DRISDOL) 1.25 MG (50000 UNIT) CAPS capsule Take 1 capsule (50,000 Units total) by mouth every 7 (seven) days.   No facility-administered encounter medications on file as of 09/20/2022.    Allergies (verified) Other   History: Past Medical History:  Diagnosis Date   Allergy    Hyperlipidemia 03/03/2020   Medical history non-contributory    PONV (postoperative nausea and vomiting)    Past Surgical History:  Procedure Laterality  Date   CATARACT EXTRACTION W/PHACO Right 04/06/2015   Procedure: CATARACT EXTRACTION PHACO AND INTRAOCULAR LENS PLACEMENT (IOC);  Surgeon: Gemma Payor, MD;  Location: [facility];  Service: Ophthalmology;  Laterality: Right;  CDE:8.27   CHOLECYSTECTOMY     Family History  Problem Relation Age of Onset   Hypertension Mother    Diabetes Father    Hypertension Father    Stroke Father    Social History   Socioeconomic History   Marital status: Married    Spouse name: Onalee Hua   Number of children: 0   Years of education: 11   Highest education level: 11th grade  Occupational History    Employer: [employer]  Tobacco Use   Smoking status: Every Day    Packs/day: 0.50    Years: 30.00    Additional pack years: 0.00    Total pack years: 15.00    Types: Cigarettes   Smokeless tobacco: Never  Vaping Use   Vaping Use: Never used  Substance and Sexual Activity   Alcohol use: Yes    Comment: occassional   Drug use: No   Sexual activity: Yes    Birth control/protection: Post-menopausal  Other Topics Concern   Not on file  Social History Narrative   Not on file   Social Determinants of Health   Financial Resource Strain: Low Risk  (09/20/2022)   Overall Financial Resource Strain (CARDIA)    Difficulty of Paying Living Expenses: Not hard at all  Food Insecurity: No Food Insecurity (09/20/2022)   Hunger Vital Sign    Worried About Running Out of Food in the Last Year: Never true    Ran Out of Food in the Last Year: Never true  Transportation Needs: No Transportation Needs (09/20/2022)   PRAPARE - Administrator, Civil Service (Medical): No    Lack of Transportation (Non-Medical): No  Physical Activity: Insufficiently Active (09/20/2022)   Exercise Vital Sign    Days of Exercise per Week: 3 days    Minutes of Exercise per Session: 30 min  Stress: No Stress Concern Present (09/20/2022)   [employer] of Occupational Health - Occupational Stress Questionnaire     Feeling of Stress : Not at all  Social Connections: Moderately Isolated (09/20/2022)   Social Connection and Isolation Panel [NHANES]    Frequency of Communication with Friends and Family: More than three times a week    Frequency of Social Gatherings with Friends and Family: More than three times a week    Attends Religious Services: Never    Database administrator or Organizations: No    Attends Engineer, structural: Never    Marital Status: Married    Tobacco Counseling Ready to quit: Not Answered Counseling given: Not Answered   Clinical Intake:  Pre-visit preparation completed: Yes  Pain : No/denies pain     Nutritional Risks: None Diabetes: No  How often do you need to have someone help you when you read instructions, pamphlets, or other written materials from your doctor or pharmacy?: 1 - Never  Diabetic?no   Interpreter Needed?: No  Information entered by :: Renie Ora, LPN   Activities of Daily Living    09/20/2022    2:51 PM  In your present state of health, do you have any difficulty performing the following activities:  Hearing? 0  Vision? 0  Difficulty concentrating or making decisions? 0  Walking or climbing stairs? 0  Dressing or bathing? 0  Doing errands, shopping? 0  Preparing Food and eating ? N  Using the Toilet? N  In the past six months, have you accidently leaked urine? N  Do you have problems with loss of bowel control? N  Managing your Medications? N  Managing your Finances? N  Housekeeping or managing your Housekeeping? N    Patient Care Team: Gabriel Earing, FNP as PCP - General (Family Medicine)  Indicate any recent Medical Services you may have received from other than [facility] providers in the past year (date may be approximate).     Assessment:   This is a routine wellness examination for Catherine Hudson.  Hearing/Vision screen Vision Screening - Comments:: Wears rx glasses - up to date with routine eye exams with   Dr.Johnson   Dietary issues and exercise activities discussed: Current Exercise Habits: Home exercise routine, Type of exercise: walking, Time (Minutes): 30, Frequency (Times/Week): 3, Weekly Exercise (Minutes/Week): 90, Intensity: Mild, Exercise limited by: None identified   Goals Addressed             This Visit's Progress    Exercise 3x per week (30 min per time)         Depression Screen    09/20/2022    2:50 PM 06/29/2022    1:53 PM 09/20/2021   10:44 AM 03/25/2021    8:11 AM 02/18/2020    8:57 AM 02/08/2016   10:20 AM 02/05/2016    3:15 PM  PHQ 2/9 Scores  PHQ - 2  Score 0 0 0 0 0 0 0  PHQ- 9 Score  0 0        Fall Risk    09/20/2022    2:49 PM 06/29/2022    1:53 PM 09/20/2021   10:44 AM 03/25/2021    8:11 AM 02/18/2020    8:57 AM  Fall Risk   Falls in the past year? 0 0 0 0 0  Number falls in past yr: 0      Injury with Fall? 0      Risk for fall due to : No Fall Risks      Follow up Falls prevention discussed        FALL RISK PREVENTION PERTAINING TO THE HOME:  Any stairs in or around the home? No  If so, are there any without handrails? No  Home free of loose throw rugs in walkways, pet beds, electrical cords, etc? Yes  Adequate lighting in your home to reduce risk of falls? Yes   ASSISTIVE DEVICES UTILIZED TO PREVENT FALLS:  Life alert? No  Use of a cane, walker or w/c? No  Grab bars in the bathroom? Yes  Shower chair or bench in shower? No  Elevated toilet seat or a handicapped toilet? No          09/20/2022    2:51 PM  6CIT Screen  What Year? 0 points  What month? 0 points  What time? 0 points  Count back from 20 0 points  Months in reverse 0 points  Repeat phrase 0 points  Total Score 0 points    Immunizations Immunization History  Administered Date(s) Administered   Fluad Quad(high Dose 65+) 04/14/2022   Influenza, Seasonal, Injecte, Preservative Fre 02/26/2019   Influenza,inj,Quad PF,6+ Mos 02/06/2013, 02/19/2014, 02/18/2020,  03/19/2021   Moderna Sars-Covid-2 Vaccination 12/30/2019, 01/27/2020   PNEUMOCOCCAL CONJUGATE-20 09/20/2021   Tdap 06/22/2015    TDAP status: Up to date  Flu Vaccine status: Up to date  Pneumococcal vaccine status: Up to date  Covid-19 vaccine status: Completed vaccines  Qualifies for Shingles Vaccine? Yes   Zostavax completed No   Shingrix Completed?: No.    Education has been provided regarding the importance of this vaccine. Patient has been advised to call insurance company to determine out of pocket expense if they have not yet received this vaccine. Advised may also receive vaccine at local pharmacy or Health Dept. Verbalized acceptance and understanding.  Screening Tests Health Maintenance  Topic Date Due   Zoster Vaccines- Shingrix (1 of 2) 12/22/2022 (Originally 06/30/2006)   COVID-19 Vaccine (3 - 2023-24 season) 07/15/2023 (Originally 01/07/2022)   MAMMOGRAM  10/23/2022   INFLUENZA VACCINE  12/08/2022   Medicare Annual Wellness (AWV)  09/20/2023   Fecal DNA (Cologuard)  04/13/2024   DTaP/Tdap/Td (2 - Td or Tdap) 06/21/2025   Pneumonia Vaccine 78+ Years old  Completed   DEXA SCAN  Completed   Hepatitis C Screening  Completed   HPV VACCINES  Aged Out    Health Maintenance  There are no preventive care reminders to display for this patient.   Colorectal cancer screening: Type of screening: Cologuard. Completed 04/13/2021. Repeat every 3 years  Mammogram status: Completed 10/22/2021. Repeat every year  Bone Density status: Completed 09/21/2021. Results reflect: Bone density results: OSTEOPENIA. Repeat every 5 years.  Lung Cancer Screening: (Low Dose CT Chest recommended if Age 52-80 years, 30 pack-year currently smoking OR have quit w/in 15years.) does qualify.   Lung Cancer Screening Referral: referral 09/20/2022  Additional Screening:  Hepatitis C Screening: does not qualify; Completed 06/22/2015  Vision Screening: Recommended annual ophthalmology exams for  early detection of glaucoma and other disorders of the eye. Is the patient up to date with their annual eye exam?  Yes  Who is the provider or what is the name of the office in which the patient attends annual eye exams? Dr.johnson  If pt is not established with a provider, would they like to be referred to a provider to establish care? No .   Dental Screening: Recommended annual dental exams for proper oral hygiene  Community Resource Referral / Chronic Care Management: CRR required this visit?  No   CCM required this visit?  No      Plan:     I have personally reviewed and noted the following in the patient's chart:   Medical and social history Use of alcohol, tobacco or illicit drugs  Current medications and supplements including opioid prescriptions. Patient is not currently taking opioid prescriptions. Functional ability and status Nutritional status Physical activity Advanced directives List of other physicians Hospitalizations, surgeries, and ER visits in previous 12 months Vitals Screenings to include cognitive, depression, and falls Referrals and appointments  In addition, I have reviewed and discussed with patient certain preventive protocols, quality metrics, and best practice recommendations. A written personalized care plan for preventive services as well as general preventive health recommendations were provided to patient.     Lorrene Reid, LPN   1/61/0960   Nurse Notes: none

## 2022-09-20 NOTE — Patient Instructions (Signed)
Catherine Hudson , Thank you for taking time to come for your Medicare Wellness Visit. I appreciate your ongoing commitment to your health goals. Please review the following plan we discussed and let me know if I can assist you in the future.   These are the goals we discussed:  Goals      Exercise 3x per week (30 min per time)        This is a list of the screening recommended for you and due dates:  Health Maintenance  Topic Date Due   Zoster (Shingles) Vaccine (1 of 2) 12/22/2022*   COVID-19 Vaccine (3 - 2023-24 season) 07/15/2023*   Mammogram  10/23/2022   Flu Shot  12/08/2022   Medicare Annual Wellness Visit  09/20/2023   Cologuard (Stool DNA test)  04/13/2024   DTaP/Tdap/Td vaccine (2 - Td or Tdap) 06/21/2025   Pneumonia Vaccine  Completed   DEXA scan (bone density measurement)  Completed   Hepatitis C Screening: USPSTF Recommendation to screen - Ages 70-79 yo.  Completed   HPV Vaccine  Aged Out  *Topic was postponed. The date shown is not the original due date.    Advanced directives: Advance directive discussed with you today. I have provided a copy for you to complete at home and have notarized. Once this is complete please bring a copy in to our office so we can scan it into your chart.   Conditions/risks identified: Aim for 30 minutes of exercise or brisk walking, 6-8 glasses of water, and 5 servings of fruits and vegetables each day.   Next appointment: Follow up in one year for your annual wellness visit    Preventive Care 65 Years and Older, Female Preventive care refers to lifestyle choices and visits with your health care provider that can promote health and wellness. What does preventive care include? A yearly physical exam. This is also called an annual well check. Dental exams once or twice a year. Routine eye exams. Ask your health care provider how often you should have your eyes checked. Personal lifestyle choices, including: Daily care of your teeth and  gums. Regular physical activity. Eating a healthy diet. Avoiding tobacco and drug use. Limiting alcohol use. Practicing safe sex. Taking low-dose aspirin every day. Taking vitamin and mineral supplements as recommended by your health care provider. What happens during an annual well check? The services and screenings done by your health care provider during your annual well check will depend on your age, overall health, lifestyle risk factors, and family history of disease. Counseling  Your health care provider may ask you questions about your: Alcohol use. Tobacco use. Drug use. Emotional well-being. Home and relationship well-being. Sexual activity. Eating habits. History of falls. Memory and ability to understand (cognition). Work and work Astronomer. Reproductive health. Screening  You may have the following tests or measurements: Height, weight, and BMI. Blood pressure. Lipid and cholesterol levels. These may be checked every 5 years, or more frequently if you are over 52 years old. Skin check. Lung cancer screening. You may have this screening every year starting at age 56 if you have a 30-pack-year history of smoking and currently smoke or have quit within the past 15 years. Fecal occult blood test (FOBT) of the stool. You may have this test every year starting at age 71. Flexible sigmoidoscopy or colonoscopy. You may have a sigmoidoscopy every 5 years or a colonoscopy every 10 years starting at age 10. Hepatitis C blood test. Hepatitis B blood test.  Sexually transmitted disease (STD) testing. Diabetes screening. This is done by checking your blood sugar (glucose) after you have not eaten for a while (fasting). You may have this done every 1-3 years. Bone density scan. This is done to screen for osteoporosis. You may have this done starting at age 49. Mammogram. This may be done every 1-2 years. Talk to your health care provider about how often you should have regular  mammograms. Talk with your health care provider about your test results, treatment options, and if necessary, the need for more tests. Vaccines  Your health care provider may recommend certain vaccines, such as: Influenza vaccine. This is recommended every year. Tetanus, diphtheria, and acellular pertussis (Tdap, Td) vaccine. You may need a Td booster every 10 years. Zoster vaccine. You may need this after age 59. Pneumococcal 13-valent conjugate (PCV13) vaccine. One dose is recommended after age 50. Pneumococcal polysaccharide (PPSV23) vaccine. One dose is recommended after age 75. Talk to your health care provider about which screenings and vaccines you need and how often you need them. This information is not intended to replace advice given to you by your health care provider. Make sure you discuss any questions you have with your health care provider. Document Released: 05/22/2015 Document Revised: 01/13/2016 Document Reviewed: 02/24/2015 Elsevier Interactive Patient Education  2017 [place] Prevention in the Home Falls can cause injuries. They can happen to people of all ages. There are many things you can do to make your home safe and to help prevent falls. What can I do on the outside of my home? Regularly fix the edges of walkways and driveways and fix any cracks. Remove anything that might make you trip as you walk through a door, such as a raised step or threshold. Trim any bushes or trees on the path to your home. Use bright outdoor lighting. Clear any walking paths of anything that might make someone trip, such as rocks or tools. Regularly check to see if handrails are loose or broken. Make sure that both sides of any steps have handrails. Any raised decks and porches should have guardrails on the edges. Have any leaves, snow, or ice cleared regularly. Use sand or salt on walking paths during winter. Clean up any spills in your garage right away. This includes oil  or grease spills. What can I do in the bathroom? Use night lights. Install grab bars by the toilet and in the tub and shower. Do not use towel bars as grab bars. Use non-skid mats or decals in the tub or shower. If you need to sit down in the shower, use a plastic, non-slip stool. Keep the floor dry. Clean up any water that spills on the floor as soon as it happens. Remove soap buildup in the tub or shower regularly. Attach bath mats securely with double-sided non-slip rug tape. Do not have throw rugs and other things on the floor that can make you trip. What can I do in the bedroom? Use night lights. Make sure that you have a light by your bed that is easy to reach. Do not use any sheets or blankets that are too big for your bed. They should not hang down onto the floor. Have a firm chair that has side arms. You can use this for support while you get dressed. Do not have throw rugs and other things on the floor that can make you trip. What can I do in the kitchen? Clean up any spills right away.  Avoid walking on wet floors. Keep items that you use a lot in easy-to-reach places. If you need to reach something above you, use a strong step stool that has a grab bar. Keep electrical cords out of the way. Do not use floor polish or wax that makes floors slippery. If you must use wax, use non-skid floor wax. Do not have throw rugs and other things on the floor that can make you trip. What can I do with my stairs? Do not leave any items on the stairs. Make sure that there are handrails on both sides of the stairs and use them. Fix handrails that are broken or loose. Make sure that handrails are as long as the stairways. Check any carpeting to make sure that it is firmly attached to the stairs. Fix any carpet that is loose or worn. Avoid having throw rugs at the top or bottom of the stairs. If you do have throw rugs, attach them to the floor with carpet tape. Make sure that you have a light  switch at the top of the stairs and the bottom of the stairs. If you do not have them, ask someone to add them for you. What else can I do to help prevent falls? Wear shoes that: Do not have high heels. Have rubber bottoms. Are comfortable and fit you well. Are closed at the toe. Do not wear sandals. If you use a stepladder: Make sure that it is fully opened. Do not climb a closed stepladder. Make sure that both sides of the stepladder are locked into place. Ask someone to hold it for you, if possible. Clearly mark and make sure that you can see: Any grab bars or handrails. First and last steps. Where the edge of each step is. Use tools that help you move around (mobility aids) if they are needed. These include: Canes. Walkers. Scooters. Crutches. Turn on the lights when you go into a dark area. Replace any light bulbs as soon as they burn out. Set up your furniture so you have a clear path. Avoid moving your furniture around. If any of your floors are uneven, fix them. If there are any pets around you, be aware of where they are. Review your medicines with your doctor. Some medicines can make you feel dizzy. This can increase your chance of falling. Ask your doctor what other things that you can do to help prevent falls. This information is not intended to replace advice given to you by your health care provider. Make sure you discuss any questions you have with your health care provider. Document Released: 02/19/2009 Document Revised: 10/01/2015 Document Reviewed: 05/30/2014 Elsevier Interactive Patient Education  2017 [employer].

## 2022-10-06 ENCOUNTER — Encounter: Payer: Self-pay | Admitting: *Deleted

## 2022-10-07 ENCOUNTER — Ambulatory Visit: Payer: Medicare HMO | Admitting: Family Medicine

## 2022-10-25 DIAGNOSIS — Z1231 Encounter for screening mammogram for malignant neoplasm of breast: Secondary | ICD-10-CM | POA: Diagnosis not present

## 2022-10-25 LAB — HM MAMMOGRAPHY

## 2022-10-25 NOTE — Progress Notes (Signed)
[facility] referral received. Attempted to reach patient but was unable to do so. No VM available.

## 2022-11-08 ENCOUNTER — Other Ambulatory Visit: Payer: Self-pay | Admitting: Family Medicine

## 2022-11-08 DIAGNOSIS — E7800 Pure hypercholesterolemia, unspecified: Secondary | ICD-10-CM

## 2023-01-06 ENCOUNTER — Encounter: Payer: Self-pay | Admitting: Family Medicine

## 2023-01-26 ENCOUNTER — Encounter: Payer: Medicare HMO | Admitting: Family Medicine

## 2023-02-17 ENCOUNTER — Encounter: Payer: Self-pay | Admitting: Family Medicine

## 2023-02-17 ENCOUNTER — Ambulatory Visit: Payer: Medicare HMO | Admitting: Family Medicine

## 2023-02-17 ENCOUNTER — Encounter: Payer: Medicare HMO | Admitting: Family Medicine

## 2023-02-17 VITALS — BP 126/70 | HR 73 | Temp 98.1°F | Ht 64.0 in | Wt 201.5 lb

## 2023-02-17 DIAGNOSIS — Z122 Encounter for screening for malignant neoplasm of respiratory organs: Secondary | ICD-10-CM | POA: Diagnosis not present

## 2023-02-17 DIAGNOSIS — E559 Vitamin D deficiency, unspecified: Secondary | ICD-10-CM | POA: Diagnosis not present

## 2023-02-17 DIAGNOSIS — Z23 Encounter for immunization: Secondary | ICD-10-CM | POA: Diagnosis not present

## 2023-02-17 DIAGNOSIS — Z1283 Encounter for screening for malignant neoplasm of skin: Secondary | ICD-10-CM

## 2023-02-17 DIAGNOSIS — Z0000 Encounter for general adult medical examination without abnormal findings: Secondary | ICD-10-CM

## 2023-02-17 DIAGNOSIS — E782 Mixed hyperlipidemia: Secondary | ICD-10-CM

## 2023-02-17 NOTE — Patient Instructions (Signed)

## 2023-02-17 NOTE — Progress Notes (Signed)
Complete physical exam  Patient: Catherine Hudson   DOB: 19-Jan-1956   67 y.o. Female  MRN: [id number]  Subjective:    Chief Complaint  Patient presents with   Annual Exam    Catherine Hudson is a 67 y.o. female who presents today for a complete physical exam. She reports consuming a general diet.  She has been trying to stay active throughout the day with yard work and house work.  She generally feels well. She reports sleeping well. She does not have additional problems to discuss today.   She has smokes for 1/2 PPD for 40-45 years. Interested in lung cancer screening.  She is also interesting in a referral to dermatology for skin cancer screening. She has had a lot of sun exposure over the years. She was last seen many years ago and had a non-cancerous lesion removed from her face.   She has stopped her statin over the last 6 months to trial off of this. She is fasting for repeat labs today.   Most recent fall risk assessment:    09/20/2022    2:49 PM  Fall Risk   Falls in the past year? 0  Number falls in past yr: 0  Injury with Fall? 0  Risk for fall due to : No Fall Risks  Follow up Falls prevention discussed     Most recent depression screenings:    09/20/2022    2:50 PM 06/29/2022    1:53 PM  PHQ 2/9 Scores  PHQ - 2 Score 0 0  PHQ- 9 Score  0    Vision:Not within last year  and Dental: No current dental problems and Receives regular dental care  Past Medical History:  Diagnosis Date   Allergy    Hyperlipidemia 03/03/2020   Medical history non-contributory    PONV (postoperative nausea and vomiting)       Patient Care Team: Gabriel Earing, FNP as PCP - General (Family Medicine)   Outpatient Medications Prior to Visit  Medication Sig   aspirin EC 81 MG tablet Take 81 mg by mouth daily. Swallow whole.   cetirizine (ZYRTEC) 10 MG tablet Take 10 mg by mouth daily as needed for allergies.   clotrimazole (LOTRIMIN) 1 % cream Apply 1 Application topically 2 (two)  times daily.   atorvastatin (LIPITOR) 20 MG tablet TAKE 1 TABLET BY MOUTH EVERY DAY (Patient not taking: Reported on 02/17/2023)   [DISCONTINUED] Vitamin D, Ergocalciferol, (DRISDOL) 1.25 MG (50000 UNIT) CAPS capsule Take 1 capsule (50,000 Units total) by mouth every 7 (seven) days.   No facility-administered medications prior to visit.    ROS Negative unless specially indicated above in HPI.      Objective:     BP 126/70   Pulse 73   Temp 98.1 F (36.7 C) (Temporal)   Ht 5\' 4"  (1.626 m)   Wt 201 lb 8 oz (91.4 kg)   SpO2 95%   BMI 34.59 kg/m    Physical Exam Vitals and nursing note reviewed.  Constitutional:      General: She is not in acute distress.    Appearance: Normal appearance. She is not ill-appearing.  HENT:     Head: Normocephalic.     Right Ear: Tympanic membrane, ear canal and external ear normal.     Left Ear: Tympanic membrane, ear canal and external ear normal.     Nose: Nose normal.     Mouth/Throat:     Mouth: Mucous membranes are moist.  Pharynx: Oropharynx is clear.  Eyes:     Extraocular Movements: Extraocular movements intact.     Conjunctiva/sclera: Conjunctivae normal.     Pupils: Pupils are equal, round, and reactive to light.  Neck:     Thyroid: No thyroid mass, thyromegaly or thyroid tenderness.  Cardiovascular:     Rate and Rhythm: Normal rate and regular rhythm.     Pulses: Normal pulses.     Heart sounds: Normal heart sounds. No murmur heard.    No friction rub. No gallop.  Pulmonary:     Effort: Pulmonary effort is normal.     Breath sounds: Normal breath sounds.  Abdominal:     General: Bowel sounds are normal. There is no distension.     Palpations: Abdomen is soft. There is no mass.     Tenderness: There is no abdominal tenderness. There is no guarding.  Musculoskeletal:        General: No swelling or tenderness. Normal range of motion.     Cervical back: Normal range of motion and neck supple. No tenderness.     Right  lower leg: No edema.     Left lower leg: No edema.  Skin:    General: Skin is warm and dry.     Capillary Refill: Capillary refill takes less than 2 seconds.     Findings: No lesion or rash.  Neurological:     General: No focal deficit present.     Mental Status: She is alert and oriented to person, place, and time.     Cranial Nerves: No cranial nerve deficit.     Motor: No weakness.     Coordination: Coordination normal.     Gait: Gait normal.  Psychiatric:        Mood and Affect: Mood normal.        Behavior: Behavior normal.        Thought Content: Thought content normal.        Judgment: Judgment normal.      No results found for any visits on 02/17/23.     Assessment & Plan:    Routine Health Maintenance and Physical Exam  Hanah was seen today for annual exam.  Diagnoses and all orders for this visit:  Routine general medical examination at a health care facility  Mixed hyperlipidemia Fasting panel pending after stopping statin.  -     Lipid panel  Vitamin D deficiency On supplement. -     VITAMIN D 25 Hydroxy (Vit-D Deficiency, Fractures)  Skin cancer screening -     Ambulatory referral to Dermatology  Encounter for screening for lung cancer -     Ambulatory Referral Lung Cancer Screening [facility]  Need for immunization against influenza Flu vaccine today.    Immunization History  Administered Date(s) Administered   Fluad Quad(high Dose 65+) 04/14/2022   Influenza, Seasonal, Injecte, Preservative Fre 02/26/2019   Influenza,inj,Quad PF,6+ Mos 02/06/2013, 02/19/2014, 02/18/2020, 03/19/2021   Moderna Sars-Covid-2 Vaccination 12/30/2019, 01/27/2020   PNEUMOCOCCAL CONJUGATE-20 09/20/2021   Tdap 06/22/2015    Health Maintenance  Topic Date Due   INFLUENZA VACCINE  12/08/2022   Zoster Vaccines- Shingrix (1 of 2) 05/20/2023 (Originally 06/30/2006)   COVID-19 Vaccine (3 - 2023-24 season) 03/04/2024 (Originally 01/08/2023)   Medicare Annual  Wellness (AWV)  09/20/2023   MAMMOGRAM  10/25/2023   Fecal DNA (Cologuard)  04/13/2024   DTaP/Tdap/Td (2 - Td or Tdap) 06/21/2025   Pneumonia Vaccine 75+ Years old  Completed   DEXA SCAN  Completed   Hepatitis C Screening  Completed   HPV VACCINES  Aged Out    Discussed health benefits of physical activity, and encouraged her to engage in regular exercise appropriate for her age and condition.  Problem List Items Addressed This Visit       Other   Hyperlipidemia   Relevant Orders   Lipid panel   Other Visit Diagnoses     Routine general medical examination at a health care facility    -  Primary   Vitamin D deficiency       Relevant Orders   VITAMIN D 25 Hydroxy (Vit-D Deficiency, Fractures)   Skin cancer screening       Relevant Orders   Ambulatory referral to Dermatology   Encounter for screening for lung cancer       Relevant Orders   Ambulatory Referral Lung Cancer Screening [facility]   Need for immunization against influenza          Return in about 1 year (around 02/17/2024) for CPE.   The patient indicates understanding of these issues and agrees with the plan.  Gabriel Earing, FNP

## 2023-02-18 LAB — LIPID PANEL
Chol/HDL Ratio: 3.5 {ratio} (ref 0.0–4.4)
Cholesterol, Total: 222 mg/dL — ABNORMAL HIGH (ref 100–199)
HDL: 64 mg/dL (ref >39)
LDL Chol Calc (NIH): 140 mg/dL — ABNORMAL HIGH (ref 0–99)
Triglycerides: 104 mg/dL (ref 0–149)
VLDL Cholesterol Cal: 18 mg/dL (ref 5–40)

## 2023-02-18 LAB — VITAMIN D 25 HYDROXY (VIT D DEFICIENCY, FRACTURES): Vit D, 25-Hydroxy: 29.7 ng/mL — ABNORMAL LOW (ref 30.0–100.0)

## 2023-02-20 ENCOUNTER — Other Ambulatory Visit: Payer: Self-pay

## 2023-02-20 DIAGNOSIS — E7800 Pure hypercholesterolemia, unspecified: Secondary | ICD-10-CM

## 2023-02-20 MED ORDER — ATORVASTATIN CALCIUM 20 MG PO TABS: 20.0000 mg | ORAL_TABLET | Freq: Every day | ORAL | 1 refills | Status: DC

## 2023-03-08 DIAGNOSIS — X32XXXA Exposure to sunlight, initial encounter: Secondary | ICD-10-CM | POA: Diagnosis not present

## 2023-03-08 DIAGNOSIS — D2239 Melanocytic nevi of other parts of face: Secondary | ICD-10-CM | POA: Diagnosis not present

## 2023-03-08 DIAGNOSIS — C44319 Basal cell carcinoma of skin of other parts of face: Secondary | ICD-10-CM | POA: Diagnosis not present

## 2023-03-08 DIAGNOSIS — I781 Nevus, non-neoplastic: Secondary | ICD-10-CM | POA: Diagnosis not present

## 2023-03-08 DIAGNOSIS — L570 Actinic keratosis: Secondary | ICD-10-CM | POA: Diagnosis not present

## 2023-03-22 DIAGNOSIS — C44319 Basal cell carcinoma of skin of other parts of face: Secondary | ICD-10-CM | POA: Diagnosis not present

## 2023-04-19 DIAGNOSIS — C44319 Basal cell carcinoma of skin of other parts of face: Secondary | ICD-10-CM | POA: Diagnosis not present

## 2023-05-15 ENCOUNTER — Encounter: Payer: Medicare HMO | Admitting: Family Medicine

## 2023-05-23 NOTE — Progress Notes (Signed)
[facility] referral received. Attempted to reach patient but was unable to do so. No VM available.

## 2023-05-24 ENCOUNTER — Other Ambulatory Visit: Payer: Self-pay

## 2023-05-24 DIAGNOSIS — Z122 Encounter for screening for malignant neoplasm of respiratory organs: Secondary | ICD-10-CM

## 2023-05-24 DIAGNOSIS — Z87891 Personal history of nicotine dependence: Secondary | ICD-10-CM

## 2023-05-24 NOTE — Progress Notes (Signed)
 VM received from patient regarding message that I left for lung cancer screening. Attempted to call the patient back but was unable to reach the patient directly. Detailed VM left asking that the patient return my call.

## 2023-05-24 NOTE — Progress Notes (Signed)
 Received referral for initial lung cancer screening scan. Contacted patient and obtained smoking history (started age 68, current 1/2ppd smoker as of recent but has previously smoked on average 1.5ppd since starting, 66.5 pack year) as well as answering questions related to the screening process. Patient denies signs/symptoms of lung cancer such as weight loss or hemoptysis. Patient denies comorbidity that would prevent curative treatment if lung cancer were to be found. Patient is scheduled for shared decision making visit and CT scan on 06/09/2023 at 1045.

## 2023-06-08 NOTE — Patient Instructions (Signed)
You were seen today for your shared decision making visit and a low-dose CT scan for lung cancer screening.    Thank you for your participation in this lifesaving program!

## 2023-06-09 ENCOUNTER — Inpatient Hospital Stay: Payer: Medicare HMO | Attending: Oncology | Admitting: Oncology

## 2023-06-09 ENCOUNTER — Ambulatory Visit (HOSPITAL_COMMUNITY)
Admission: RE | Admit: 2023-06-09 | Discharge: 2023-06-09 | Disposition: A | Discharge status: Home / Self Care | Payer: Medicare HMO | Source: Ambulatory Visit | Attending: Oncology | Admitting: Oncology

## 2023-06-09 DIAGNOSIS — Z87891 Personal history of nicotine dependence: Secondary | ICD-10-CM

## 2023-06-09 DIAGNOSIS — F17210 Nicotine dependence, cigarettes, uncomplicated: Secondary | ICD-10-CM | POA: Diagnosis not present

## 2023-06-09 DIAGNOSIS — Z122 Encounter for screening for malignant neoplasm of respiratory organs: Secondary | ICD-10-CM

## 2023-06-09 NOTE — Progress Notes (Signed)
   [facility] [address], [place] [address]   CLINIC:  Medical Oncology/Hematology  I discussed the assessment and treatment plan with the patient. The patient was provided an opportunity to ask questions and all were answered. The patient agreed with the plan and demonstrated an understanding of the instructions.   The patient was advised to call back or seek an in-person evaluation if the symptoms worsen or if the condition fails to improve as anticipated.   In accordance with CMS guidelines, patient has met eligibility criteria including age, absence of signs or symptoms of lung cancer.  Social History   Tobacco Use   Smoking status: Every Day    Current packs/day: 0.50    Average packs/day: 0.5 packs/day for 30.0 years (15.0 ttl pk-yrs)    Types: Cigarettes   Smokeless tobacco: Never  Vaping Use   Vaping status: Never Used  Substance Use Topics   Alcohol use: Yes    Comment: occassional   Drug use: No      A shared decision-making session was conducted prior to the performance of CT scan. This includes one or more decision aids, includes benefits and harms of screening, follow-up diagnostic testing, over-diagnosis, false positive rate, and total radiation exposure.   Counseling on the importance of adherence to annual lung cancer LDCT screening, impact of co-morbidities, and ability or willingness to undergo diagnosis and treatment is imperative for compliance of the program.   Counseling on the importance of continued smoking cessation for former smokers; the importance of smoking cessation for current smokers, and information about tobacco cessation interventions have been given to patient including [employer] Quit [employer] and [employer] programs.   Written order for lung cancer screening with LDCT has been given to the patient and any and all questions have been answered to the best of my abilities.    Yearly follow up will be coordinated by Kennith Gain, RN, Thoracic Navigator.  I spent 15 minutes dedicated to the care of this patient (face-to-face and non-face-to-face) on the date of the encounter to include what is described in the assessment and plan.   Mauro Kaufmann, NP

## 2023-06-20 NOTE — Progress Notes (Signed)
Patient notified of LDCT Lung Cancer Screening Results via mail with the recommendation to follow-up in 12 months. Patient's referring provider has been sent a copy of results. Results are as follows:   IMPRESSION: 1. Lung-RADS 2, benign appearance or behavior. Continue annual screening with low-dose chest CT without contrast in 12 months. 2. Coronary artery calcifications. 3. Aortic Atherosclerosis (ICD10-I70.0) and Emphysema (ICD10-J43.9).

## 2023-07-25 ENCOUNTER — Telehealth: Payer: Self-pay | Admitting: Family Medicine

## 2023-07-27 ENCOUNTER — Other Ambulatory Visit: Payer: Self-pay

## 2023-07-27 DIAGNOSIS — Z1231 Encounter for screening mammogram for malignant neoplasm of breast: Secondary | ICD-10-CM

## 2023-07-27 NOTE — Telephone Encounter (Signed)
 Placed order waiting on PCP to sign to fax

## 2023-09-21 ENCOUNTER — Ambulatory Visit: Payer: Medicare HMO

## 2023-09-21 VITALS — BP 126/70 | HR 73 | Ht 64.0 in | Wt 201.0 lb

## 2023-09-21 DIAGNOSIS — Z0000 Encounter for general adult medical examination without abnormal findings: Secondary | ICD-10-CM | POA: Diagnosis not present

## 2023-09-21 NOTE — Progress Notes (Signed)
 Subjective:   Catherine Hudson is a 68 y.o. who presents for a Medicare Wellness preventive visit.  As a reminder, Annual Wellness Visits don't include a physical exam, and some assessments may be limited, especially if this visit is performed virtually. We may recommend an in-person visit if needed.  Visit Complete: Virtual I connected with  Catherine Hudson on 09/21/23 by a audio enabled telemedicine application and verified that I am speaking with the correct person using two identifiers.  Patient Location: Home  Provider Location: Home Office  I discussed the limitations of evaluation and management by telemedicine. The patient expressed understanding and agreed to proceed.  Vital Signs: Because this visit was a virtual/telehealth visit, some criteria may be missing or patient reported. Any vitals not documented were not able to be obtained and vitals that have been documented are patient reported.  VideoDeclined- This patient declined Librarian, academic. Therefore the visit was completed with audio only.  Persons Participating in Visit: Patient.  AWV Questionnaire: No: Patient Medicare AWV questionnaire was not completed prior to this visit.  Cardiac Risk Factors include: advanced age (>80men, >66 women);dyslipidemia;hypertension;obesity (BMI >30kg/m2)     Objective:     Today's Vitals   09/21/23 1412  BP: 126/70  Pulse: 73  Weight: 201 lb (91.2 kg)  Height: 5\' 4"  (1.626 m)   Body mass index is 34.5 kg/m.     09/21/2023    2:17 PM 09/20/2022    2:51 PM 04/06/2015   12:13 PM 03/31/2015   11:04 AM  Advanced Directives  Does Patient Have a Medical Advance Directive? No No No No  Would patient like information on creating a medical advance directive?  No - Patient declined No - patient declined information No - patient declined information    Current Medications (verified) Outpatient Encounter Medications as of 09/21/2023  Medication Sig    aspirin EC 81 MG tablet Take 81 mg by mouth daily. Swallow whole.   atorvastatin  (LIPITOR) 20 MG tablet Take 1 tablet (20 mg total) by mouth daily.   cetirizine (ZYRTEC) 10 MG tablet Take 10 mg by mouth daily as needed for allergies.   clotrimazole  (LOTRIMIN ) 1 % cream Apply 1 Application topically 2 (two) times daily.   No facility-administered encounter medications on file as of 09/21/2023.    Allergies (verified) Other   History: Past Medical History:  Diagnosis Date   Allergy    Hyperlipidemia 03/03/2020   Medical history non-contributory    PONV (postoperative nausea and vomiting)    Past Surgical History:  Procedure Laterality Date   CATARACT EXTRACTION W/PHACO Right 04/06/2015   Procedure: CATARACT EXTRACTION PHACO AND INTRAOCULAR LENS PLACEMENT (IOC);  Surgeon: Anner Kill, MD;  Location: [facility];  Service: Ophthalmology;  Laterality: Right;  CDE:8.27   CHOLECYSTECTOMY     Family History  Problem Relation Age of Onset   Hypertension Mother    Diabetes Father    Hypertension Father    Stroke Father    Social History   Socioeconomic History   Marital status: Married    Spouse name: Myrtie Atkinson   Number of children: 0   Years of education: 11   Highest education level: 11th grade  Occupational History    Employer: [employer]  Tobacco Use   Smoking status: Every Day    Current packs/day: 0.50    Average packs/day: 0.5 packs/day for 30.0 years (15.0 ttl pk-yrs)    Types: Cigarettes   Smokeless tobacco: Never  Vaping Use   Vaping status: Never Used  Substance and Sexual Activity   Alcohol use: Yes    Comment: occassional   Drug use: No   Sexual activity: Yes    Birth control/protection: Post-menopausal  Other Topics Concern   Not on file  Social History Narrative   Not on file   Social Drivers of Health   Financial Resource Strain: Low Risk  (09/21/2023)   Overall Financial Resource Strain (CARDIA)    Difficulty of Paying Living Expenses: Not hard at  all  Food Insecurity: No Food Insecurity (09/21/2023)   Hunger Vital Sign    Worried About Running Out of Food in the Last Year: Never true    Ran Out of Food in the Last Year: Never true  Transportation Needs: No Transportation Needs (09/21/2023)   PRAPARE - Administrator, Civil Service (Medical): No    Lack of Transportation (Non-Medical): No  Physical Activity: Insufficiently Active (09/21/2023)   Exercise Vital Sign    Days of Exercise per Week: 3 days    Minutes of Exercise per Session: 30 min  Stress: No Stress Concern Present (09/21/2023)   [employer] of Occupational Health - Occupational Stress Questionnaire    Feeling of Stress : Not at all  Social Connections: Moderately Isolated (09/21/2023)   Social Connection and Isolation Panel [NHANES]    Frequency of Communication with Friends and Family: Twice a week    Frequency of Social Gatherings with Friends and Family: Twice a week    Attends Religious Services: Never    Database administrator or Organizations: No    Attends Engineer, structural: Never    Marital Status: Married    Tobacco Counseling Ready to quit: No Counseling given: Yes    Clinical Intake:  Pre-visit preparation completed: Yes  Pain : No/denies pain     BMI - recorded: 34.5 Nutritional Status: BMI > 30  Obese Nutritional Risks: None Diabetes: No  Lab Results  Component Value Date   HGBA1C 5.5 06/24/2015     How often do you need to have someone help you when you read instructions, pamphlets, or other written materials from your doctor or pharmacy?: 1 - Never  Interpreter Needed?: No  Information entered by :: Alia t/cma   Activities of Daily Living     09/21/2023    2:15 PM  In your present state of health, do you have any difficulty performing the following activities:  Hearing? 0  Vision? 0  Comment Pt has implant lenses/pt goes [facility] in [place], [place]  Difficulty concentrating or making decisions? 0   Walking or climbing stairs? 0  Dressing or bathing? 0  Doing errands, shopping? 0  Preparing Food and eating ? N  Using the Toilet? N  In the past six months, have you accidently leaked urine? N  Do you have problems with loss of bowel control? N  Managing your Medications? N  Managing your Finances? N  Housekeeping or managing your Housekeeping? N    Patient Care Team: Albertha Huger, FNP as PCP - General (Family Medicine)  Indicate any recent Medical Services you may have received from other than [facility] providers in the past year (date may be approximate).     Assessment:    This is a routine wellness examination for Ganiyah.  Hearing/Vision screen Hearing Screening - Comments:: Pt hearing dif Vision Screening - Comments:: Pt has implant lenses/pt goes [facility] in [place], [place]  Goals Addressed             This Visit's Progress    Exercise 3x per week (30 min per time)   On track      Depression Screen     09/21/2023    2:20 PM 02/17/2023    8:31 AM 09/20/2022    2:50 PM 06/29/2022    1:53 PM 09/20/2021   10:44 AM 03/25/2021    8:11 AM 02/18/2020    8:57 AM  PHQ 2/9 Scores  PHQ - 2 Score 0 0 0 0 0 0 0  PHQ- 9 Score 0 0  0 0      Fall Risk     09/21/2023    2:14 PM 02/17/2023    8:30 AM 09/20/2022    2:49 PM 06/29/2022    1:53 PM 09/20/2021   10:44 AM  Fall Risk   Falls in the past year? 0 0 0 0 0  Number falls in past yr: 0  0    Injury with Fall? 0  0    Risk for fall due to : No Fall Risks  No Fall Risks    Follow up Falls prevention discussed;Falls evaluation completed  Falls prevention discussed      MEDICARE RISK AT HOME:  Medicare Risk at Home Any stairs in or around the home?: Yes If so, are there any without handrails?: Yes Home free of loose throw rugs in walkways, pet beds, electrical cords, etc?: Yes Adequate lighting in your home to reduce risk of falls?: Yes Life alert?: No Use of a cane, walker or w/c?: No Grab bars in the bathroom?:  Yes Shower chair or bench in shower?: Yes Elevated toilet seat or a handicapped toilet?: Yes  TIMED UP AND GO:  Was the test performed?  no  Cognitive Function: 6CIT completed        09/21/2023    2:21 PM 09/20/2022    2:51 PM  6CIT Screen  What Year? 0 points 0 points  What month? 0 points 0 points  What time? 0 points 0 points  Count back from 20 0 points 0 points  Months in reverse 0 points 0 points  Repeat phrase 0 points 0 points  Total Score 0 points 0 points    Immunizations Immunization History  Administered Date(s) Administered   Fluad Quad(high Dose 65+) 04/14/2022   Fluad Trivalent(High Dose 65+) 02/17/2023   Influenza, Seasonal, Injecte, Preservative Fre 02/26/2019   Influenza,inj,Quad PF,6+ Mos 02/06/2013, 02/19/2014, 02/18/2020, 03/19/2021   Moderna Sars-Covid-2 Vaccination 12/30/2019, 01/27/2020   PNEUMOCOCCAL CONJUGATE-20 09/20/2021   Tdap 06/22/2015    Screening Tests Health Maintenance  Topic Date Due   Zoster Vaccines- Shingrix (1 of 2) Never done   COVID-19 Vaccine (3 - 2024-25 season) 03/04/2024 (Originally 01/08/2023)   MAMMOGRAM  10/25/2023   INFLUENZA VACCINE  12/08/2023   Fecal DNA (Cologuard)  04/13/2024   Medicare Annual Wellness (AWV)  09/20/2024   DTaP/Tdap/Td (2 - Td or Tdap) 06/21/2025   Pneumonia Vaccine 71+ Years old  Completed   DEXA SCAN  Completed   Hepatitis C Screening  Completed   HPV VACCINES  Aged Out   Meningococcal B Vaccine  Aged Out    Health Maintenance  Health Maintenance Due  Topic Date Due   Zoster Vaccines- Shingrix (1 of 2) Never done   Health Maintenance Items Addressed: See Nurse Notes  Additional Screening:  Vision Screening: Recommended annual ophthalmology exams for early detection of glaucoma and  other disorders of the eye.  Dental Screening: Recommended annual dental exams for proper oral hygiene  Community Resource Referral / Chronic Care Management: CRR required this visit?  No   CCM  required this visit?  No   Plan:    I have personally reviewed and noted the following in the patient's chart:   Medical and social history Use of alcohol, tobacco or illicit drugs  Current medications and supplements including opioid prescriptions. Patient is not currently taking opioid prescriptions. Functional ability and status Nutritional status Physical activity Advanced directives List of other physicians Hospitalizations, surgeries, and ER visits in previous 12 months Vitals Screenings to include cognitive, depression, and falls Referrals and appointments  In addition, I have reviewed and discussed with patient certain preventive protocols, quality metrics, and best practice recommendations. A written personalized care plan for preventive services as well as general preventive health recommendations were provided to patient.   Michaelle Adolphus, CMA   09/21/2023   After Visit Summary: (MyChart) Due to this being a telephonic visit, the after visit summary with patients personalized plan was offered to patient via MyChart   Notes: pt is aware and has encouraged to get the Shingles vaccine at her next ov w/pcp.

## 2023-09-21 NOTE — Patient Instructions (Signed)
 Ms. Catherine Hudson , Thank you for taking time out of your busy schedule to complete your Annual Wellness Visit with me. I enjoyed our conversation and look forward to speaking with you again next year. I, as well as your care team,  appreciate your ongoing commitment to your health goals. Please review the following plan we discussed and let me know if I can assist you in the future. Your Game plan/ To Do List   Follow up Visits: Next Medicare AWV with our clinical staff: Thursday, 09/23/24 at 11:20a.m.   Next Office Visit with your provider: n/a  Clinician Recommendations:  Aim for 30 minutes of exercise or brisk walking, 6-8 glasses of water, and 5 servings of fruits and vegetables each day. N/a      This is a list of the screening recommended for you and due dates:  Health Maintenance  Topic Date Due   Zoster (Shingles) Vaccine (1 of 2) Never done   COVID-19 Vaccine (3 - 2024-25 season) 03/04/2024*   Mammogram  10/25/2023   Flu Shot  12/08/2023   Cologuard (Stool DNA test)  04/13/2024   Medicare Annual Wellness Visit  09/20/2024   DTaP/Tdap/Td vaccine (2 - Td or Tdap) 06/21/2025   Pneumonia Vaccine  Completed   DEXA scan (bone density measurement)  Completed   Hepatitis C Screening  Completed   HPV Vaccine  Aged Out   Meningitis B Vaccine  Aged Out  *Topic was postponed. The date shown is not the original due date.    Advanced directives: (Declined) Advance directive discussed with you today. Even though you declined this today, please call our office should you change your mind, and we can give you the proper paperwork for you to fill out. Advance Care Planning is important because it:  [x]  Makes sure you receive the medical care that is consistent with your values, goals, and preferences  [x]  It provides guidance to your family and loved ones and reduces their decisional burden about whether or not they are making the right decisions based on your wishes.  Follow the link provided in  your after visit summary or read over the paperwork we have mailed to you to help you started getting your Advance Directives in place. If you need assistance in completing these, please reach out to us  so that we can help you!  See attachments for Preventive Care and Fall Prevention Tips.

## 2023-10-27 DIAGNOSIS — Z1231 Encounter for screening mammogram for malignant neoplasm of breast: Secondary | ICD-10-CM | POA: Diagnosis not present

## 2023-10-27 LAB — HM MAMMOGRAPHY

## 2023-11-10 ENCOUNTER — Other Ambulatory Visit: Payer: Self-pay | Admitting: Family Medicine

## 2023-11-10 DIAGNOSIS — E7800 Pure hypercholesterolemia, unspecified: Secondary | ICD-10-CM

## 2023-11-13 NOTE — Telephone Encounter (Signed)
 Left detailed message for patient making her aware that we scheduled her for her yearly CPE on 03/11/24 because that was first available and advised patient to call the office to r/s if this date did not work for her.

## 2023-11-13 NOTE — Telephone Encounter (Signed)
 Refill sent in but pt ntbs for CPE around 10/27 with PCP for further refills.

## 2024-02-10 ENCOUNTER — Other Ambulatory Visit: Payer: Self-pay | Admitting: Family Medicine

## 2024-02-10 DIAGNOSIS — E7800 Pure hypercholesterolemia, unspecified: Secondary | ICD-10-CM

## 2024-02-15 ENCOUNTER — Other Ambulatory Visit: Payer: Self-pay | Admitting: Family Medicine

## 2024-02-15 DIAGNOSIS — E7800 Pure hypercholesterolemia, unspecified: Secondary | ICD-10-CM

## 2024-02-15 NOTE — Telephone Encounter (Signed)
 Copied from CRM [id number]. Topic: Clinical - Medication Refill >> Feb 15, 2024  9:29 AM Myrick T wrote: Medication: atorvastatin  (LIPITOR) 20 MG tablet  Has the patient contacted their pharmacy? No  This is the patient's preferred pharmacy:  [id number] - [place], [place] - [address] [place] [address] Phone: [phone] Fax: [phone]  Is this the correct pharmacy for this prescription? Yes If no, delete pharmacy and type the correct one.   Has the prescription been filled recently? Yes  Is the patient out of the medication? No  Has the patient been seen for an appointment in the last year OR does the patient have an upcoming appointment? Yes  Can we respond through MyChart? Yes  Agent: Please be advised that Rx refills may take up to 3 business days. We ask that you follow-up with your pharmacy.

## 2024-02-16 ENCOUNTER — Ambulatory Visit (INDEPENDENT_AMBULATORY_CARE_PROVIDER_SITE_OTHER)

## 2024-02-16 DIAGNOSIS — Z23 Encounter for immunization: Secondary | ICD-10-CM | POA: Diagnosis not present

## 2024-03-06 DIAGNOSIS — X32XXXD Exposure to sunlight, subsequent encounter: Secondary | ICD-10-CM | POA: Diagnosis not present

## 2024-03-06 DIAGNOSIS — Z08 Encounter for follow-up examination after completed treatment for malignant neoplasm: Secondary | ICD-10-CM | POA: Diagnosis not present

## 2024-03-06 DIAGNOSIS — L570 Actinic keratosis: Secondary | ICD-10-CM | POA: Diagnosis not present

## 2024-03-06 DIAGNOSIS — Z85828 Personal history of other malignant neoplasm of skin: Secondary | ICD-10-CM | POA: Diagnosis not present

## 2024-03-06 DIAGNOSIS — L918 Other hypertrophic disorders of the skin: Secondary | ICD-10-CM | POA: Diagnosis not present

## 2024-03-11 ENCOUNTER — Ambulatory Visit

## 2024-03-11 ENCOUNTER — Ambulatory Visit (INDEPENDENT_AMBULATORY_CARE_PROVIDER_SITE_OTHER): Payer: Self-pay | Admitting: Family Medicine

## 2024-03-11 ENCOUNTER — Other Ambulatory Visit: Payer: Self-pay | Admitting: Family Medicine

## 2024-03-11 VITALS — BP 133/74 | HR 75 | Temp 98.1°F | Ht 64.0 in | Wt 186.8 lb

## 2024-03-11 DIAGNOSIS — E782 Mixed hyperlipidemia: Secondary | ICD-10-CM

## 2024-03-11 DIAGNOSIS — Z0000 Encounter for general adult medical examination without abnormal findings: Secondary | ICD-10-CM

## 2024-03-11 DIAGNOSIS — Z0001 Encounter for general adult medical examination with abnormal findings: Secondary | ICD-10-CM

## 2024-03-11 DIAGNOSIS — Z1211 Encounter for screening for malignant neoplasm of colon: Secondary | ICD-10-CM

## 2024-03-11 DIAGNOSIS — Z6832 Body mass index (BMI) 32.0-32.9, adult: Secondary | ICD-10-CM | POA: Diagnosis not present

## 2024-03-11 DIAGNOSIS — F4321 Adjustment disorder with depressed mood: Secondary | ICD-10-CM

## 2024-03-11 DIAGNOSIS — Z13820 Encounter for screening for osteoporosis: Secondary | ICD-10-CM

## 2024-03-11 DIAGNOSIS — M8580 Other specified disorders of bone density and structure, unspecified site: Secondary | ICD-10-CM

## 2024-03-11 DIAGNOSIS — E559 Vitamin D deficiency, unspecified: Secondary | ICD-10-CM

## 2024-03-11 DIAGNOSIS — E66811 Obesity, class 1: Secondary | ICD-10-CM

## 2024-03-11 DIAGNOSIS — Z780 Asymptomatic menopausal state: Secondary | ICD-10-CM

## 2024-03-11 DIAGNOSIS — E6609 Other obesity due to excess calories: Secondary | ICD-10-CM | POA: Diagnosis not present

## 2024-03-11 LAB — BAYER DCA HB A1C WAIVED: HB A1C (BAYER DCA - WAIVED): 5.9 % — ABNORMAL HIGH (ref 4.8–5.6)

## 2024-03-11 NOTE — Patient Instructions (Signed)

## 2024-03-11 NOTE — Progress Notes (Signed)
 Complete physical exam  Patient: Catherine Hudson   DOB: 11-21-1955   68 y.o. Female  MRN: [id number]  Subjective:    Chief Complaint  Patient presents with   Annual Exam    Catherine Hudson is a 68 y.o. female who presents today for a complete physical exam. She reports consuming a general diet. Home exercise routine includes walking, yard work. She generally feels fairly well. She reports sleeping fairly well. She does not have additional problems to discuss today.   Her husband passed in August. She does have some trouble sleeping and her appetite has been up and down. She tries to stay busy to keep herself occupied. She feels like she is managing ok with all things considered.   Most recent fall risk assessment:    09/21/2023    2:14 PM  Fall Risk   Falls in the past year? 0  Number falls in past yr: 0  Injury with Fall? 0  Risk for fall due to : No Fall Risks  Follow up Falls prevention discussed;Falls evaluation completed     Most recent depression screenings:    03/11/2024    1:59 PM 09/21/2023    2:20 PM 02/17/2023    8:31 AM  Depression screen PHQ 2/9  Decreased Interest 0 0 0  Down, Depressed, Hopeless 0 0 0  PHQ - 2 Score 0 0 0  Altered sleeping 0 0 0  Tired, decreased energy 0 0 0  Change in appetite 0 0 0  Feeling bad or failure about yourself  0 0 0  Trouble concentrating 0 0 0  Moving slowly or fidgety/restless 0 0 0  Suicidal thoughts 0 0 0  PHQ-9 Score 0 0 0  Difficult doing work/chores Not difficult at all Not difficult at all Not difficult at all      03/11/2024    2:00 PM 02/17/2023    8:30 AM 06/29/2022    1:54 PM 09/20/2021   10:44 AM  GAD 7 : Generalized Anxiety Score  Nervous, Anxious, on Edge 0 0 0 0  Control/stop worrying 0 0 0 0  Worry too much - different things 0 0 0 0  Trouble relaxing 0 0 0 0  Restless 0 0 0 0  Easily annoyed or irritable 0 0 0 0  Afraid - awful might happen 0 0 0 0  Total GAD 7 Score 0 0 0 0  Anxiety Difficulty Not  difficult at all Not difficult at all Not difficult at all Not difficult at all         Patient Care Team: Catherine Hudson CHRISTELLA, FNP as PCP - General (Family Medicine)   Outpatient Medications Prior to Visit  Medication Sig   aspirin EC 81 MG tablet Take 81 mg by mouth daily. Swallow whole.   atorvastatin  (LIPITOR) 20 MG tablet TAKE 1 TABLET BY MOUTH EVERY DAY   cetirizine (ZYRTEC) 10 MG tablet Take 10 mg by mouth daily as needed for allergies.   Cholecalciferol (VITAMIN D -3 PO) Take 1,000 Units by mouth daily.   Collagen-Vitamin C-Biotin (COLLAGEN PO) Take by mouth.   [DISCONTINUED] clotrimazole  (LOTRIMIN ) 1 % cream Apply 1 Application topically 2 (two) times daily.   No facility-administered medications prior to visit.    ROS Negative unless specially indicated above in HPI.     Objective:     BP 133/74   Pulse 75   Temp 98.1 F (36.7 C) (Temporal)   Ht 5' 4 (1.626 m)  Wt 186 lb 12.8 oz (84.7 kg)   SpO2 98%   BMI 32.06 kg/m  Wt Readings from Last 3 Encounters:  03/11/24 186 lb 12.8 oz (84.7 kg)  09/21/23 201 lb (91.2 kg)  02/17/23 201 lb 8 oz (91.4 kg)      Physical Exam Vitals and nursing note reviewed.  Constitutional:      General: She is not in acute distress.    Appearance: Normal appearance. She is not ill-appearing, toxic-appearing or diaphoretic.  HENT:     Head: Normocephalic.     Right Ear: Tympanic membrane, ear canal and external ear normal.     Left Ear: Tympanic membrane, ear canal and external ear normal.     Nose: Nose normal.     Mouth/Throat:     Mouth: Mucous membranes are moist.     Pharynx: Oropharynx is clear.  Eyes:     Extraocular Movements: Extraocular movements intact.     Conjunctiva/sclera: Conjunctivae normal.     Pupils: Pupils are equal, round, and reactive to light.  Cardiovascular:     Rate and Rhythm: Normal rate and regular rhythm.     Pulses: Normal pulses.     Heart sounds: Normal heart sounds. No murmur heard.     No friction rub. No gallop.  Pulmonary:     Effort: Pulmonary effort is normal.     Breath sounds: Normal breath sounds.  Abdominal:     General: Bowel sounds are normal. There is no distension.     Palpations: Abdomen is soft. There is no mass.     Tenderness: There is no abdominal tenderness. There is no guarding.  Musculoskeletal:        General: No tenderness.     Cervical back: Normal range of motion and neck supple. No tenderness.     Right lower leg: No edema.     Left lower leg: No edema.  Skin:    General: Skin is warm and dry.     Capillary Refill: Capillary refill takes less than 2 seconds.     Findings: No lesion or rash.  Neurological:     General: No focal deficit present.     Mental Status: She is alert and oriented to person, place, and time.     Cranial Nerves: No cranial nerve deficit.     Motor: No weakness.     Coordination: Coordination normal.     Gait: Gait normal.  Psychiatric:        Mood and Affect: Mood normal.        Behavior: Behavior normal.        Thought Content: Thought content normal.        Judgment: Judgment normal.      No results found for any visits on 03/11/24.     Assessment & Plan:    Routine Health Maintenance and Physical Exam  [name] was seen today for annual exam.  Diagnoses and all orders for this visit:  Routine general medical examination at a health care facility  Grief Declines referral or medication today.   Mixed hyperlipidemia Fasting labs pending.  -     Lipid panel  Class 1 obesity due to excess calories with serious comorbidity and body mass index (BMI) of 32.0 to 32.9 in adult Weight has trended down. She has increased her physical activity.  -     CBC with Differential/Platelet -     CMP14+EGFR -     TSH -  Bayer DCA Hb A1c Waived  Vitamin D  deficiency -     VITAMIN D  25 Hydroxy (Vit-D Deficiency, Fractures)  Osteopenia, unspecified location DEXA today.   Colon cancer screening -      Cologuard    Immunization History  Administered Date(s) Administered   Fluad Quad(high Dose 65+) 04/14/2022   Fluad Trivalent(High Dose 65+) 02/17/2023   INFLUENZA, HIGH DOSE SEASONAL PF 02/16/2024   Influenza, Seasonal, Injecte, Preservative Fre 02/26/2019   Influenza,inj,Quad PF,6+ Mos 02/06/2013, 02/19/2014, 02/18/2020, 03/19/2021   Moderna Sars-Covid-2 Vaccination 12/30/2019, 01/27/2020   PNEUMOCOCCAL CONJUGATE-20 09/20/2021   Tdap 06/22/2015    Health Maintenance  Topic Date Due   DEXA SCAN  09/22/2023   COVID-19 Vaccine (3 - 2025-26 season) 03/27/2025 (Originally 01/08/2024)   Zoster Vaccines- Shingrix (1 of 2) 06/11/2025 (Originally 06/30/2006)   Fecal DNA (Cologuard)  04/13/2024   Medicare Annual Wellness (AWV)  09/20/2024   Mammogram  10/26/2024   DTaP/Tdap/Td (2 - Td or Tdap) 06/21/2025   Pneumococcal Vaccine: 50+ Years  Completed   Influenza Vaccine  Completed   Hepatitis C Screening  Completed   Meningococcal B Vaccine  Aged Out    Discussed health benefits of physical activity, and encouraged her to engage in regular exercise appropriate for her age and condition.  Problem List Items Addressed This Visit   None Visit Diagnoses       Routine general medical examination at a health care facility    -  Primary      Return in 1 year (on 03/11/2025).   The patient indicates understanding of these issues and agrees with the plan.  Hudson CHRISTELLA Search, FNP

## 2024-03-12 ENCOUNTER — Ambulatory Visit (INDEPENDENT_AMBULATORY_CARE_PROVIDER_SITE_OTHER)

## 2024-03-12 DIAGNOSIS — Z780 Asymptomatic menopausal state: Secondary | ICD-10-CM

## 2024-03-12 LAB — CBC WITH DIFFERENTIAL/PLATELET
Basophils Absolute: 0.1 x10E3/uL (ref 0.0–0.2)
Basos: 1 %
EOS (ABSOLUTE): 0.1 x10E3/uL (ref 0.0–0.4)
Eos: 1 %
Hematocrit: 41.7 % (ref 34.0–46.6)
Hemoglobin: 13.8 g/dL (ref 11.1–15.9)
Immature Grans (Abs): 0 x10E3/uL (ref 0.0–0.1)
Immature Granulocytes: 0 %
Lymphocytes Absolute: 3.2 x10E3/uL — ABNORMAL HIGH (ref 0.7–3.1)
Lymphs: 31 %
MCH: 30.7 pg (ref 26.6–33.0)
MCHC: 33.1 g/dL (ref 31.5–35.7)
MCV: 93 fL (ref 79–97)
Monocytes Absolute: 0.5 x10E3/uL (ref 0.1–0.9)
Monocytes: 5 %
Neutrophils Absolute: 6.3 x10E3/uL (ref 1.4–7.0)
Neutrophils: 62 %
Platelets: 238 x10E3/uL (ref 150–450)
RBC: 4.49 x10E6/uL (ref 3.77–5.28)
RDW: 12.2 % (ref 11.7–15.4)
WBC: 10.2 x10E3/uL (ref 3.4–10.8)

## 2024-03-12 LAB — CMP14+EGFR
ALT: 18 IU/L (ref 0–32)
AST: 17 IU/L (ref 0–40)
Albumin: 4.4 g/dL (ref 3.9–4.9)
Alkaline Phosphatase: 76 IU/L (ref 49–135)
BUN/Creatinine Ratio: 13 (ref 12–28)
BUN: 11 mg/dL (ref 8–27)
Bilirubin Total: 1 mg/dL (ref 0.0–1.2)
CO2: 22 mmol/L (ref 20–29)
Calcium: 9.5 mg/dL (ref 8.7–10.3)
Chloride: 104 mmol/L (ref 96–106)
Creatinine, Ser: 0.87 mg/dL (ref 0.57–1.00)
Globulin, Total: 2.1 g/dL (ref 1.5–4.5)
Glucose: 83 mg/dL (ref 70–99)
Potassium: 4.4 mmol/L (ref 3.5–5.2)
Sodium: 138 mmol/L (ref 134–144)
Total Protein: 6.5 g/dL (ref 6.0–8.5)
eGFR: 73 mL/min/1.73 (ref >59)

## 2024-03-12 LAB — LIPID PANEL
Chol/HDL Ratio: 2.4 ratio (ref 0.0–4.4)
Cholesterol, Total: 149 mg/dL (ref 100–199)
HDL: 61 mg/dL (ref >39)
LDL Chol Calc (NIH): 71 mg/dL (ref 0–99)
Triglycerides: 88 mg/dL (ref 0–149)
VLDL Cholesterol Cal: 17 mg/dL (ref 5–40)

## 2024-03-12 LAB — TSH: TSH: 2.13 u[IU]/mL (ref 0.450–4.500)

## 2024-03-12 LAB — VITAMIN D 25 HYDROXY (VIT D DEFICIENCY, FRACTURES): Vit D, 25-Hydroxy: 34.8 ng/mL (ref 30.0–100.0)

## 2024-03-13 ENCOUNTER — Ambulatory Visit: Payer: Self-pay | Admitting: Family Medicine

## 2024-03-14 DIAGNOSIS — M85851 Other specified disorders of bone density and structure, right thigh: Secondary | ICD-10-CM | POA: Diagnosis not present

## 2024-03-14 DIAGNOSIS — M85852 Other specified disorders of bone density and structure, left thigh: Secondary | ICD-10-CM | POA: Diagnosis not present

## 2024-03-14 DIAGNOSIS — Z780 Asymptomatic menopausal state: Secondary | ICD-10-CM | POA: Diagnosis not present

## 2024-03-15 ENCOUNTER — Ambulatory Visit: Payer: Self-pay | Admitting: Family Medicine

## 2024-03-15 DIAGNOSIS — M818 Other osteoporosis without current pathological fracture: Secondary | ICD-10-CM

## 2024-03-18 ENCOUNTER — Telehealth: Payer: Self-pay

## 2024-03-18 NOTE — Progress Notes (Signed)
 Care Guide Pharmacy Note  03/18/2024 Name: [name] MRN: [id number] DOB: Mar 05, 1956  Referred By: Joesph Annabella CHRISTELLA, FNP Reason for referral: Complex Care Management (Outreach to schedule with Pharm d )   Catherine Hudson is a 68 y.o. year old female who is a primary care patient of Joesph Annabella CHRISTELLA, FNP.  LEVITA MONICAL was referred to the pharmacist for assistance related to: osteoporosis  Successful contact was made with the patient to discuss pharmacy services including being ready for the pharmacist to call at least 5 minutes before the scheduled appointment time and to have medication bottles and any blood pressure readings ready for review. The patient agreed to meet with the pharmacist via telephone visit on (date/time).04/10/2024  Jeoffrey Buffalo , RMA     [place]  [facility] Guide  Direct Dial: [phone]  Website: [email or web address]

## 2024-04-10 ENCOUNTER — Other Ambulatory Visit: Admitting: Pharmacist

## 2024-04-10 DIAGNOSIS — M8580 Other specified disorders of bone density and structure, unspecified site: Secondary | ICD-10-CM

## 2024-04-10 NOTE — Progress Notes (Signed)
 04/10/2024 Name: Catherine Hudson MRN: [id number] DOB: 08-Jan-1956  Chief Complaint  Patient presents with   Osteoporosis    Catherine Hudson is a 68 y.o. year old female who presented for a telephone visit.   They were referred to the pharmacist by their PCP for assistance in managing osteoporosis .    Subjective:  Care Team: Primary Care Provider: Joesph Annabella CHRISTELLA, FNP -f/u in 09/11/24   Medication Access/Adherence  Current Pharmacy:  [facility] [phone] - [place], [place] - [address] [place] [address] Phone: [phone] Fax: [phone]  Osteoporosis:  Very remote history--2 falls total History of fall down front stairs; broken tail bone 25 years ago Blowing leaves backpack blower--tripped and hurt elbow/crack-2o yrs ago  Current medications: calcium   Medications tried in the past:   Current supplements: calcium  and vitamin D   Current physical activity: has hand weights, will start at the rec center classes  Most recent DEXA:  An axial (e.g., hips, spine) and/or appendicular (e.g., radius) exam was performed, as appropriate, using [employer] [facility]. Images are obtained for bone mineral density measurement and are not obtained for diagnostic purposes. [id number]   Exclusions: L3-L4 due to significant degenerative change.   COMPARISON:  09/20/2021.   FINDINGS: Scan quality: Good.   LUMBAR SPINE (L1-L2):   BMD (in g/cm2): 1.149   T-score: -0.2   Z-score: 0.8   Rate of change from previous exam: -5.4 %   LEFT FEMORAL NECK:   BMD (in g/cm2): 0.813   T-score: -1.6   Z-score: -0.5   LEFT TOTAL HIP:   BMD (in g/cm2): 0.860   T-score: -1.2   Z-score: -0.3   RIGHT FEMORAL NECK:   BMD (in g/cm2): 0.850   T-score: -1.4   Z-score: -0.2   RIGHT TOTAL HIP:   BMD (in g/cm2): 0.866   T-score: -1.1   Z-score: -0.3   DUAL-FEMUR TOTAL MEAN:   Rate of  change from previous exam: No significant rate of change from previous exam.   FRAX 10-YEAR PROBABILITY OF FRACTURE:   Based on the World Health Organization FRAX model, the 10 year probability of a major osteoporotic fracture is 15.6%. The 10 year probability of a hip fracture is 3.3%.   IMPRESSION: Osteopenia based on BMD.   Fracture risk is increased. Increased risk is based on low BMD and history of vertebral fracture.  Current medication access support: humana   Objective:  Lab Results  Component Value Date   HGBA1C 5.9 (H) 03/11/2024    Lab Results  Component Value Date   CREATININE 0.87 03/11/2024   BUN 11 03/11/2024   NA 138 03/11/2024   K 4.4 03/11/2024   CL 104 03/11/2024   CO2 22 03/11/2024    Lab Results  Component Value Date   CHOL 149 03/11/2024   HDL 61 03/11/2024   LDLCALC 71 03/11/2024   TRIG 88 03/11/2024   CHOLHDL 2.4 03/11/2024    Medications Reviewed Today     Reviewed by Billee Mliss BIRCH, RPH-CPP (Pharmacist) on 04/10/24 at 1000  Med List Status: <None>   Medication Order Taking? Sig Documenting Provider Last Dose Status Informant  aspirin EC 81 MG tablet [id number]  Take 81 mg by mouth daily. Swallow whole. [provider]  Active   atorvastatin  (LIPITOR) 20 MG tablet [id number]  TAKE 1 TABLET BY MOUTH EVERY DAY Joesph Annabella CHRISTELLA, FNP  Active   cetirizine (ZYRTEC) 10  MG tablet [id number]  Take 10 mg by mouth daily as needed for allergies. [provider]  Active Self  Cholecalciferol (VITAMIN D -3 PO) [id number]  Take 1,000 Units by mouth daily. [provider]  Active   Collagen-Vitamin C-Biotin (COLLAGEN PO) [id number]  Take by mouth. [provider]  Active             Assessment/Plan:   Osteopenia: - Currently inappropriately managed/opportunity for optimization  Patient denies severe fractures  Low fall risk   Discussed fall prevention - Reviewed recommendation for daily calcium  intake of 1200  mg and vitamin D  intake of [phone] units  Patient has started supplementation - Recommended to choose calcium  citrate formulation due to concurrent acid reflux medication - Reviewed benefits of weight bearing exercise  She will start with hand weights and the [place] rec center - Recommend to hold off on pharmacological therapy   -explored Prolia cost and therapy is cost prohibitive at this time ($350 per dose) -Discussed smoking cessation--patient knows she needs to quit  -Recommended Max quit line- 1-800-QUIT-NOW    Follow Up Plan: encouraged patient to reach out if needed; would scan in 2 years and discuss potential pharmacological therapies  Mliss Tarry Griffin, PharmD, BCACP, CPP Clinical Pharmacist, [facility]

## 2024-04-11 ENCOUNTER — Telehealth: Payer: Self-pay | Admitting: Family Medicine

## 2024-04-11 MED ORDER — CETIRIZINE HCL 10 MG PO TABS: 10.0000 mg | ORAL_TABLET | Freq: Every day | ORAL | 3 refills | Status: AC | PRN

## 2024-04-11 NOTE — Telephone Encounter (Signed)
 Zyrtec sent in and pt aware.

## 2024-04-11 NOTE — Telephone Encounter (Signed)
 Copied from CRM [id number]. Topic: Clinical - Prescription Issue >> Apr 10, 2024  2:58 PM Geneva B wrote: Reason for CRM: patient says that she was supposed to get a rx for zyrtec a few weeks ago and she still has not got that rx please call pt back [phone]

## 2024-04-23 LAB — COLOGUARD: COLOGUARD: POSITIVE — AB

## 2024-05-07 ENCOUNTER — Encounter: Payer: Self-pay | Admitting: Gastroenterology

## 2024-05-14 ENCOUNTER — Other Ambulatory Visit: Payer: Self-pay | Admitting: Family Medicine

## 2024-05-14 DIAGNOSIS — E7800 Pure hypercholesterolemia, unspecified: Secondary | ICD-10-CM

## 2024-05-16 ENCOUNTER — Encounter: Payer: Self-pay | Admitting: *Deleted

## 2024-05-17 ENCOUNTER — Other Ambulatory Visit: Payer: Self-pay | Admitting: *Deleted

## 2024-05-17 DIAGNOSIS — F17210 Nicotine dependence, cigarettes, uncomplicated: Secondary | ICD-10-CM

## 2024-05-17 DIAGNOSIS — Z87891 Personal history of nicotine dependence: Secondary | ICD-10-CM

## 2024-05-17 DIAGNOSIS — Z122 Encounter for screening for malignant neoplasm of respiratory organs: Secondary | ICD-10-CM

## 2024-05-27 ENCOUNTER — Encounter: Payer: Self-pay | Admitting: Gastroenterology

## 2024-05-27 ENCOUNTER — Ambulatory Visit: Admitting: Gastroenterology

## 2024-05-27 ENCOUNTER — Telehealth: Payer: Self-pay | Admitting: *Deleted

## 2024-05-27 VITALS — BP 125/74 | HR 71 | Temp 98.3°F | Ht 64.0 in | Wt 182.4 lb

## 2024-05-27 DIAGNOSIS — K5900 Constipation, unspecified: Secondary | ICD-10-CM | POA: Diagnosis not present

## 2024-05-27 DIAGNOSIS — R195 Other fecal abnormalities: Secondary | ICD-10-CM | POA: Insufficient documentation

## 2024-05-27 MED ORDER — PEG 3350-KCL-NA BICARB-NACL 420 G PO SOLR: 4000.0000 mL | Freq: Once | ORAL | 0 refills | Status: AC

## 2024-05-27 NOTE — Patient Instructions (Signed)
Colonoscopy to be scheduled. 

## 2024-05-27 NOTE — H&P (View-Only) (Signed)
 "    GI Office Note    Referring Provider: Joesph Catherine HERO, FNP Primary Care Physician:  Hudson Catherine HERO, FNP  Primary Gastroenterologist:  Chief Complaint   Chief Complaint  Patient presents with   Colonoscopy    Positive cologuard     History of Present Illness   Catherine Hudson is a 69 y.o. female presenting today at the request of Catherine Joesph, FNP for positive Cologuard.  Discussed the use of AI scribe software for clinical note transcription with the patient, who gave verbal consent to proceed.  History of Present Illness Catherine Hudson is a 69 year old female with hyperlipidemia and tobacco use who presents for evaluation following a positive Cologuard test.  Cologuard was obtained for screening and returned positive. She has never had a colonoscopy.  She developed constipation after starting calcium  following a bone density test. She was straining the morning of the Cologuard test. She has stopped her calcium  supplement and bowels returned to normal. BM regular. She denies current abdominal pain, heartburn, or indigestion.  After her husband died in 12-12-24, she had reduced appetite and weight loss, but her appetite and intake have improved. She is not monitoring her weight closely and seems to have stabilized.  No FH of colon cancer.        Prior Data   Results    March 11, 2024: Glucose 83, BUN 11, creatinine 0.87, sodium 138 Total bilirubin 1, alk phos 76, AST 17, ALT 18, albumin 4.4, total protein 6.5 White blood cell count 10.2, hemoglobin 13.8, platelets 238 A1c 5.9 TSH 2.130  Medications   Current Outpatient Medications  Medication Sig Dispense Refill   aspirin EC 81 MG tablet Take 81 mg by mouth daily. Swallow whole.     atorvastatin  (LIPITOR) 20 MG tablet TAKE 1 TABLET BY MOUTH EVERY DAY 90 tablet 1   cetirizine  (ZYRTEC ) 10 MG tablet Take 1 tablet (10 mg total) by mouth daily as needed for allergies. 90 tablet 3   Cholecalciferol (VITAMIN  D-3 PO) Take 1,000 Units by mouth daily.     Collagen-Vitamin C-Biotin (COLLAGEN PO) Take by mouth.     SODIUM FLUORIDE 5000 PPM 1.1 % PSTE at bedtime.     No current facility-administered medications for this visit.    Allergies   Allergies as of 05/27/2024 - Review Complete 05/27/2024  Allergen Reaction Noted   Other Hives and Rash 03/26/2015    Past Medical History   Past Medical History:  Diagnosis Date   Allergy    Hyperlipidemia 03/03/2020   Medical history non-contributory    PONV (postoperative nausea and vomiting)     Past Surgical History   Past Surgical History:  Procedure Laterality Date   CATARACT EXTRACTION W/PHACO Right 04/06/2015   Procedure: CATARACT EXTRACTION PHACO AND INTRAOCULAR LENS PLACEMENT (IOC);  Surgeon: Cherene Mania, MD;  Location: [facility];  Service: Ophthalmology;  Laterality: Right;  CDE:8.27   CHOLECYSTECTOMY      Past Family History   Family History  Problem Relation Age of Onset   Hypertension Mother    Alzheimer's disease Mother    Diabetes Father    Hypertension Father    Stroke Father    Colon cancer Neg Hx     Past Social History   Social History   Socioeconomic History   Marital status: Married    Spouse name: Alm   Number of children: 0   Years of education: 11   Highest education level: 11th  grade  Occupational History    Employer: [employer]  Tobacco Use   Smoking status: Every Day    Current packs/day: 0.50    Average packs/day: 0.5 packs/day for 30.0 years (15.0 ttl pk-yrs)    Types: Cigarettes   Smokeless tobacco: Never  Vaping Use   Vaping status: Never Used  Substance and Sexual Activity   Alcohol use: Yes    Comment: occassional   Drug use: No   Sexual activity: Yes    Birth control/protection: Post-menopausal  Other Topics Concern   Not on file  Social History Narrative   Not on file   Social Drivers of Health   Tobacco Use: High Risk (05/27/2024)   Patient History    Smoking Tobacco  Use: Every Day    Smokeless Tobacco Use: Never    Passive Exposure: Not on file  Financial Resource Strain: Low Risk (03/11/2024)   Overall Financial Resource Strain (CARDIA)    Difficulty of Paying Living Expenses: Not very hard  Food Insecurity: No Food Insecurity (03/11/2024)   Epic    Worried About Programme Researcher, Broadcasting/film/video in the Last Year: Never true    Ran Out of Food in the Last Year: Never true  Transportation Needs: No Transportation Needs (03/11/2024)   Epic    Lack of Transportation (Medical): No    Lack of Transportation (Non-Medical): No  Physical Activity: Sufficiently Active (03/11/2024)   Exercise Vital Sign    Days of Exercise per Week: 7 days    Minutes of Exercise per Session: 120 min  Stress: No Stress Concern Present (03/11/2024)   [employer] of Occupational Health - Occupational Stress Questionnaire    Feeling of Stress: Not at all  Social Connections: Socially Isolated (03/11/2024)   Social Connection and Isolation Panel    Frequency of Communication with Friends and Family: More than three times a week    Frequency of Social Gatherings with Friends and Family: Three times a week    Attends Religious Services: Never    Active Member of Clubs or Organizations: No    Attends Banker Meetings: Not on file    Marital Status: Widowed  Intimate Partner Violence: Not At Risk (09/21/2023)   Humiliation, Afraid, Rape, and Kick questionnaire    Fear of Current or Ex-Partner: No    Emotionally Abused: No    Physically Abused: No    Sexually Abused: No  Depression (PHQ2-9): Low Risk (03/11/2024)   Depression (PHQ2-9)    PHQ-2 Score: 0  Alcohol Screen: Low Risk (03/11/2024)   Alcohol Screen    Last Alcohol Screening Score (AUDIT): 2  Housing: Unknown (03/11/2024)   Epic    Unable to Pay for Housing in the Last Year: No    Number of Times Moved in the Last Year: Not on file    Homeless in the Last Year: No  Utilities: Not At Risk (09/21/2023)   AHC  Utilities    Threatened with loss of utilities: No  Health Literacy: Adequate Health Literacy (09/21/2023)   B1300 Health Literacy    Frequency of need for help with medical instructions: Never    Review of Systems   General: Negative for anorexia, weight loss, fever, chills, fatigue, weakness. Eyes: Negative for vision changes.  ENT: Negative for hoarseness, difficulty swallowing , nasal congestion. CV: Negative for chest pain, angina, palpitations, dyspnea on exertion, peripheral edema.  Respiratory: Negative for dyspnea at rest, dyspnea on exertion, cough, sputum, wheezing.  GI: See history of  present illness. GU:  Negative for dysuria, hematuria, urinary incontinence, urinary frequency, nocturnal urination.  MS: Negative for joint pain, low back pain.  Derm: Negative for rash or itching.  Neuro: Negative for weakness, abnormal sensation, seizure, frequent headaches, memory loss,  confusion.  Psych: Negative for anxiety, depression, suicidal ideation, hallucinations.  Endo: Negative for unusual weight change.  Heme: Negative for bruising or bleeding. Allergy: Negative for rash or hives.  Physical Exam   BP 125/74   Pulse 71   Temp 98.3 F (36.8 C) (Oral)   Ht 5' 4 (1.626 m)   Wt 182 lb 6.4 oz (82.7 kg)   SpO2 97%   BMI 31.31 kg/m    General: Well-nourished, well-developed in no acute distress.  Head: Normocephalic, atraumatic.   Eyes: Conjunctiva pink, no icterus. Mouth: Oropharyngeal mucosa moist and pink  Neck: Supple without thyromegaly, masses, or lymphadenopathy.  Lungs: Clear to auscultation bilaterally.  Heart: Regular rate and rhythm, no murmurs rubs or gallops.  Abdomen: Bowel sounds are normal, nontender, nondistended, no hepatosplenomegaly or masses,  no abdominal bruits or hernia, no rebound or guarding.   Rectal: not performed Extremities: No lower extremity edema. No clubbing or deformities.  Neuro: Alert and oriented x 4 , grossly normal  neurologically.  Skin: Warm and dry, no rash or jaundice.   Psych: Alert and cooperative, normal mood and affect.  Labs   See above Imaging Studies   No results found.  Assessment/Plan:    Assessment & Plan Cologuard positive: -no FH of colon cancer -no prior colonoscopy -colonoscopy in near future. ASA 2.  I have discussed the risks, alternatives, benefits with regards to but not limited to the risk of reaction to medication, bleeding, infection, perforation and the patient is agreeable to proceed. Written consent to be obtained.   Constipation Constipation likely due to increased calcium  supplementation and poor diet post-bereavement. Resolved after dietary improvements and stopping extra calcium .     Sonny RAMAN. Ezzard, MHS, PA-C [facility]  "

## 2024-05-27 NOTE — Telephone Encounter (Signed)
 Pt called back and scheduled with Dr. Shaaron on 05/31/24. Aware will send her instructions via mychart and rx for prep [facility]

## 2024-05-27 NOTE — Telephone Encounter (Signed)
 LMOVM to call back to schedule TCS w/ soonest provider, asa 2

## 2024-05-27 NOTE — Progress Notes (Signed)
 "    GI Office Note    Referring Provider: Joesph Annabella HERO, FNP Primary Care Physician:  Joesph Annabella HERO, FNP  Primary Gastroenterologist:  Chief Complaint   Chief Complaint  Patient presents with   Colonoscopy    Positive cologuard     History of Present Illness   Catherine Hudson is a 69 y.o. female presenting today at the request of Annabella Joesph, FNP for positive Cologuard.  Discussed the use of AI scribe software for clinical note transcription with the patient, who gave verbal consent to proceed.  History of Present Illness Catherine Hudson is a 69 year old female with hyperlipidemia and tobacco use who presents for evaluation following a positive Cologuard test.  Cologuard was obtained for screening and returned positive. She has never had a colonoscopy.  She developed constipation after starting calcium  following a bone density test. She was straining the morning of the Cologuard test. She has stopped her calcium  supplement and bowels returned to normal. BM regular. She denies current abdominal pain, heartburn, or indigestion.  After her husband died in 12/11/2024, she had reduced appetite and weight loss, but her appetite and intake have improved. She is not monitoring her weight closely and seems to have stabilized.  No FH of colon cancer.        Prior Data   Results    March 11, 2024: Glucose 83, BUN 11, creatinine 0.87, sodium 138 Total bilirubin 1, alk phos 76, AST 17, ALT 18, albumin 4.4, total protein 6.5 White blood cell count 10.2, hemoglobin 13.8, platelets 238 A1c 5.9 TSH 2.130  Medications   Current Outpatient Medications  Medication Sig Dispense Refill   aspirin EC 81 MG tablet Take 81 mg by mouth daily. Swallow whole.     atorvastatin  (LIPITOR) 20 MG tablet TAKE 1 TABLET BY MOUTH EVERY DAY 90 tablet 1   cetirizine  (ZYRTEC ) 10 MG tablet Take 1 tablet (10 mg total) by mouth daily as needed for allergies. 90 tablet 3   Cholecalciferol (VITAMIN  D-3 PO) Take 1,000 Units by mouth daily.     Collagen-Vitamin C-Biotin (COLLAGEN PO) Take by mouth.     SODIUM FLUORIDE 5000 PPM 1.1 % PSTE at bedtime.     No current facility-administered medications for this visit.    Allergies   Allergies as of 05/27/2024 - Review Complete 05/27/2024  Allergen Reaction Noted   Other Hives and Rash 03/26/2015    Past Medical History   Past Medical History:  Diagnosis Date   Allergy    Hyperlipidemia 03/03/2020   Medical history non-contributory    PONV (postoperative nausea and vomiting)     Past Surgical History   Past Surgical History:  Procedure Laterality Date   CATARACT EXTRACTION W/PHACO Right 04/06/2015   Procedure: CATARACT EXTRACTION PHACO AND INTRAOCULAR LENS PLACEMENT (IOC);  Surgeon: Cherene Mania, MD;  Location: [facility];  Service: Ophthalmology;  Laterality: Right;  CDE:8.27   CHOLECYSTECTOMY      Past Family History   Family History  Problem Relation Age of Onset   Hypertension Mother    Alzheimer's disease Mother    Diabetes Father    Hypertension Father    Stroke Father    Colon cancer Neg Hx     Past Social History   Social History   Socioeconomic History   Marital status: Married    Spouse name: Alm   Number of children: 0   Years of education: 11   Highest education level: 11th  grade  Occupational History    Employer: [employer]  Tobacco Use   Smoking status: Every Day    Current packs/day: 0.50    Average packs/day: 0.5 packs/day for 30.0 years (15.0 ttl pk-yrs)    Types: Cigarettes   Smokeless tobacco: Never  Vaping Use   Vaping status: Never Used  Substance and Sexual Activity   Alcohol use: Yes    Comment: occassional   Drug use: No   Sexual activity: Yes    Birth control/protection: Post-menopausal  Other Topics Concern   Not on file  Social History Narrative   Not on file   Social Drivers of Health   Tobacco Use: High Risk (05/27/2024)   Patient History    Smoking Tobacco  Use: Every Day    Smokeless Tobacco Use: Never    Passive Exposure: Not on file  Financial Resource Strain: Low Risk (03/11/2024)   Overall Financial Resource Strain (CARDIA)    Difficulty of Paying Living Expenses: Not very hard  Food Insecurity: No Food Insecurity (03/11/2024)   Epic    Worried About Programme Researcher, Broadcasting/film/video in the Last Year: Never true    Ran Out of Food in the Last Year: Never true  Transportation Needs: No Transportation Needs (03/11/2024)   Epic    Lack of Transportation (Medical): No    Lack of Transportation (Non-Medical): No  Physical Activity: Sufficiently Active (03/11/2024)   Exercise Vital Sign    Days of Exercise per Week: 7 days    Minutes of Exercise per Session: 120 min  Stress: No Stress Concern Present (03/11/2024)   [employer] of Occupational Health - Occupational Stress Questionnaire    Feeling of Stress: Not at all  Social Connections: Socially Isolated (03/11/2024)   Social Connection and Isolation Panel    Frequency of Communication with Friends and Family: More than three times a week    Frequency of Social Gatherings with Friends and Family: Three times a week    Attends Religious Services: Never    Active Member of Clubs or Organizations: No    Attends Banker Meetings: Not on file    Marital Status: Widowed  Intimate Partner Violence: Not At Risk (09/21/2023)   Humiliation, Afraid, Rape, and Kick questionnaire    Fear of Current or Ex-Partner: No    Emotionally Abused: No    Physically Abused: No    Sexually Abused: No  Depression (PHQ2-9): Low Risk (03/11/2024)   Depression (PHQ2-9)    PHQ-2 Score: 0  Alcohol Screen: Low Risk (03/11/2024)   Alcohol Screen    Last Alcohol Screening Score (AUDIT): 2  Housing: Unknown (03/11/2024)   Epic    Unable to Pay for Housing in the Last Year: No    Number of Times Moved in the Last Year: Not on file    Homeless in the Last Year: No  Utilities: Not At Risk (09/21/2023)   [facility]  Utilities    Threatened with loss of utilities: No  Health Literacy: Adequate Health Literacy (09/21/2023)   B1300 Health Literacy    Frequency of need for help with medical instructions: Never    Review of Systems   General: Negative for anorexia, weight loss, fever, chills, fatigue, weakness. Eyes: Negative for vision changes.  ENT: Negative for hoarseness, difficulty swallowing , nasal congestion. CV: Negative for chest pain, angina, palpitations, dyspnea on exertion, peripheral edema.  Respiratory: Negative for dyspnea at rest, dyspnea on exertion, cough, sputum, wheezing.  GI: See history of  present illness. GU:  Negative for dysuria, hematuria, urinary incontinence, urinary frequency, nocturnal urination.  MS: Negative for joint pain, low back pain.  Derm: Negative for rash or itching.  Neuro: Negative for weakness, abnormal sensation, seizure, frequent headaches, memory loss,  confusion.  Psych: Negative for anxiety, depression, suicidal ideation, hallucinations.  Endo: Negative for unusual weight change.  Heme: Negative for bruising or bleeding. Allergy: Negative for rash or hives.  Physical Exam   BP 125/74   Pulse 71   Temp 98.3 F (36.8 C) (Oral)   Ht 5' 4 (1.626 m)   Wt 182 lb 6.4 oz (82.7 kg)   SpO2 97%   BMI 31.31 kg/m    General: Well-nourished, well-developed in no acute distress.  Head: Normocephalic, atraumatic.   Eyes: Conjunctiva pink, no icterus. Mouth: Oropharyngeal mucosa moist and pink  Neck: Supple without thyromegaly, masses, or lymphadenopathy.  Lungs: Clear to auscultation bilaterally.  Heart: Regular rate and rhythm, no murmurs rubs or gallops.  Abdomen: Bowel sounds are normal, nontender, nondistended, no hepatosplenomegaly or masses,  no abdominal bruits or hernia, no rebound or guarding.   Rectal: not performed Extremities: No lower extremity edema. No clubbing or deformities.  Neuro: Alert and oriented x 4 , grossly normal  neurologically.  Skin: Warm and dry, no rash or jaundice.   Psych: Alert and cooperative, normal mood and affect.  Labs   See above Imaging Studies   No results found.  Assessment/Plan:    Assessment & Plan Cologuard positive: -no FH of colon cancer -no prior colonoscopy -colonoscopy in near future. ASA 2.  I have discussed the risks, alternatives, benefits with regards to but not limited to the risk of reaction to medication, bleeding, infection, perforation and the patient is agreeable to proceed. Written consent to be obtained.   Constipation Constipation likely due to increased calcium  supplementation and poor diet post-bereavement. Resolved after dietary improvements and stopping extra calcium .     Sonny RAMAN. Ezzard, MHS, PA-C [facility]  "

## 2024-05-29 ENCOUNTER — Ambulatory Visit: Admitting: Gastroenterology

## 2024-05-31 ENCOUNTER — Encounter (HOSPITAL_COMMUNITY)
Admission: RE | Disposition: A | Discharge status: Home / Self Care | Payer: Self-pay | Source: Home / Self Care | Attending: Internal Medicine

## 2024-05-31 ENCOUNTER — Encounter (HOSPITAL_COMMUNITY): Payer: Self-pay | Admitting: Internal Medicine

## 2024-05-31 ENCOUNTER — Ambulatory Visit (HOSPITAL_COMMUNITY)

## 2024-05-31 ENCOUNTER — Other Ambulatory Visit: Payer: Self-pay

## 2024-05-31 ENCOUNTER — Ambulatory Visit (HOSPITAL_COMMUNITY)
Admission: RE | Admit: 2024-05-31 | Discharge: 2024-05-31 | Disposition: A | Discharge status: Home / Self Care | Attending: Internal Medicine | Admitting: Internal Medicine

## 2024-05-31 DIAGNOSIS — D128 Benign neoplasm of rectum: Secondary | ICD-10-CM | POA: Insufficient documentation

## 2024-05-31 DIAGNOSIS — K5900 Constipation, unspecified: Secondary | ICD-10-CM | POA: Diagnosis not present

## 2024-05-31 DIAGNOSIS — Z1211 Encounter for screening for malignant neoplasm of colon: Secondary | ICD-10-CM

## 2024-05-31 DIAGNOSIS — D214 Benign neoplasm of connective and other soft tissue of abdomen: Secondary | ICD-10-CM

## 2024-05-31 DIAGNOSIS — F17210 Nicotine dependence, cigarettes, uncomplicated: Secondary | ICD-10-CM | POA: Diagnosis not present

## 2024-05-31 DIAGNOSIS — R195 Other fecal abnormalities: Secondary | ICD-10-CM

## 2024-05-31 MED ORDER — LIDOCAINE 2% (20 MG/ML) 5 ML SYRINGE
INTRAMUSCULAR | Status: DC | PRN
  Administered 2024-05-31: 40 mg via INTRAVENOUS

## 2024-05-31 MED ORDER — STERILE WATER FOR IRRIGATION IR SOLN
Status: DC | PRN
  Administered 2024-05-31: 60 mL

## 2024-05-31 MED ORDER — PROPOFOL 500 MG/50ML IV EMUL
INTRAVENOUS | Status: DC | PRN
  Administered 2024-05-31: 150 ug/kg/min via INTRAVENOUS
  Administered 2024-05-31: 50 mg via INTRAVENOUS
  Administered 2024-05-31: 20 mg via INTRAVENOUS

## 2024-05-31 MED ORDER — LACTATED RINGERS IV SOLN
INTRAVENOUS | Status: DC
  Administered 2024-05-31: 500 mL via INTRAVENOUS

## 2024-05-31 NOTE — Transfer of Care (Signed)
 Immediate Anesthesia Transfer of Care Note  Patient: Catherine Hudson  Procedure(s) Performed: COLONOSCOPY POLYPECTOMY, INTESTINE  Patient Location: Short Stay  Anesthesia Type:General  Level of Consciousness: awake, alert , and oriented  Airway & Oxygen Therapy: Patient Spontanous Breathing  Post-op Assessment: Report given to RN and Post -op Vital signs reviewed and stable  Post vital signs: Reviewed and stable  Last Vitals:  Vitals Value Taken Time  BP 100/67 05/31/24 13:37  Temp 36.4 C 05/31/24 13:37  Pulse 64 05/31/24 13:37  Resp 17 05/31/24 13:37  SpO2 99 % 05/31/24 13:37    Last Pain:  Vitals:   05/31/24 1337  TempSrc: Oral  PainSc: 0-No pain      Patients Stated Pain Goal: 9 (05/31/24 1109)  Complications: No notable events documented.

## 2024-05-31 NOTE — Op Note (Signed)
 [facility] Patient Name: Catherine Hudson Procedure Date: 05/31/2024 12:27 PM MRN: [id number] Date of Birth: [birth year] Attending MD: Lamar Ozell Hollingshead , MD, [id number] CSN: [id number] Age: 69 Admit Type: Outpatient Procedure:                Colonoscopy Indications:              Positive Cologuard test Providers:                Lamar Ozell Hollingshead, MD, Ashley Goins, Kristine L.                            Shirlean Balm, Technician Referring MD:              Medicines:                Propofol  per Anesthesia Complications:            No immediate complications. Estimated Blood Loss:     Estimated blood loss: none. Procedure:                Pre-Anesthesia Assessment:                           - Prior to the procedure, a History and Physical                            was performed, and patient medications and                            allergies were reviewed. The patient's tolerance of                            previous anesthesia was also reviewed. The risks                            and benefits of the procedure and the sedation                            options and risks were discussed with the patient.                            All questions were answered, and informed consent                            was obtained. Prior Anticoagulants: The patient has                            taken no anticoagulant or antiplatelet agents. ASA                            Grade Assessment: III - A patient with severe                            systemic disease. After reviewing the risks and  benefits, the patient was deemed in satisfactory                            condition to undergo the procedure.                           After obtaining informed consent, the colonoscope                            was passed under direct vision. Throughout the                            procedure, the patient's blood pressure, pulse, and                            oxygen  saturations were monitored continuously. The                            [id number] ([id number]) Colon was introduced through                            the anus and advanced to the the cecum, identified                            by appendiceal orifice and ileocecal valve. The                            colonoscopy was performed without difficulty. Scope In: 1:15:26 PM Scope Out: 1:33:44 PM Scope Withdrawal Time: 0 hours 13 minutes 45 seconds  Total Procedure Duration: 0 hours 18 minutes 18 seconds  Findings:      The perianal and digital rectal examinations were normal.      Two semi-pedunculated polyps were found in the mid rectum. The polyps       were 5 to 8 mm in size. These polyps were removed with a hot snare.       Resection and retrieval were complete. Estimated blood loss: none.      The exam was otherwise without abnormality on direct and retroflexion       views. Impression:               - Two 5 to 8 mm polyps, removed with a hot snare.                            Resected and retrieved.                           - The examination was otherwise normal on direct                            and retroflexion views. Moderate Sedation:      Moderate (conscious) sedation was personally administered by an       anesthesia professional. The following parameters were monitored: oxygen       saturation, heart rate, blood pressure, respiratory rate, EKG, adequacy       of pulmonary ventilation, and response  to care. Recommendation:           - Patient has a contact number available for                            emergencies. The signs and symptoms of potential                            delayed complications were discussed with the                            patient. Return to normal activities tomorrow.                            Written discharge instructions were provided to the                            patient.                           - Advance diet as tolerated.                            - Continue present medications.                           - Repeat colonoscopy after studies are complete for                            surveillance.                           - Return to GI office (date not yet determined). Procedure Code(s):        --- Professional ---                           [phone], Colonoscopy, flexible; with removal of                            tumor(s), polyp(s), or other lesion(s) by snare                            technique Diagnosis Code(s):        --- Professional ---                           D12.6, Benign neoplasm of colon, unspecified                           R19.5, Other fecal abnormalities CPT copyright 2022 [employer]. All rights reserved. The codes documented in this report are preliminary and upon coder review may  be revised to meet current compliance requirements. Lamar HERO. Joni Norrod, MD Lamar Ozell Hollingshead, MD 05/31/2024 1:38:53 PM This report has been signed electronically. Number of Addenda: 0

## 2024-05-31 NOTE — Anesthesia Postprocedure Evaluation (Signed)
"   Anesthesia Post Note  Patient: Catherine Hudson  Procedure(s) Performed: COLONOSCOPY POLYPECTOMY, INTESTINE  Patient location during evaluation: PACU Anesthesia Type: General Level of consciousness: awake and alert Pain management: pain level controlled Vital Signs Assessment: post-procedure vital signs reviewed and stable Respiratory status: spontaneous breathing, nonlabored ventilation, respiratory function stable and patient connected to nasal cannula oxygen Cardiovascular status: blood pressure returned to baseline and stable Postop Assessment: no apparent nausea or vomiting Anesthetic complications: no   No notable events documented.   Last Vitals:  Vitals:   05/31/24 1109 05/31/24 1337  BP: 119/70 100/67  Pulse: 67 64  Resp: 17 17  Temp: 36.8 C 36.4 C  SpO2: 100% 99%    Last Pain:  Vitals:   05/31/24 1337  TempSrc: Oral  PainSc: 0-No pain                 Andrea Limes      "

## 2024-05-31 NOTE — Interval H&P Note (Signed)
 History and Physical Interval Note:  05/31/2024 12:52 PM  Catherine Hudson  has presented today for surgery, with the diagnosis of positive cologuard.  The various methods of treatment have been discussed with the patient and family. After consideration of risks, benefits and other options for treatment, the patient has consented to  Procedures with comments: COLONOSCOPY (N/A) - 12:30pm, asa 2 as a surgical intervention.  The patient's history has been reviewed, patient examined, no change in status, stable for surgery.  I have reviewed the patient's chart and labs.  Questions were answered to the patient's satisfaction.     Ayaka Andes  No change.  Positive Cologuard.  Diagnostic colonoscopy today per plan.  The risks, benefits, limitations, alternatives and imponderables have been reviewed with the patient. Questions have been answered. All parties are agreeable.

## 2024-05-31 NOTE — Discharge Instructions (Addendum)
" °  Colonoscopy Discharge Instructions  Read the instructions outlined below and refer to this sheet in the next few weeks. These discharge instructions provide you with general information on caring for yourself after you leave the hospital. Your doctor may also give you specific instructions. While your treatment has been planned according to the most current medical practices available, unavoidable complications occasionally occur. If you have any problems or questions after discharge, call Dr. Shaaron at [phone]. ACTIVITY You may resume your regular activity, but move at a slower pace for the next 24 hours.  Take frequent rest periods for the next 24 hours.  Walking will help get rid of the air and reduce the bloated feeling in your belly (abdomen).  No driving for 24 hours (because of the medicine (anesthesia) used during the test).   Do not sign any important legal documents or operate any machinery for 24 hours (because of the anesthesia used during the test).  NUTRITION Drink plenty of fluids.  You may resume your normal diet as instructed by your doctor.  Begin with a light meal and progress to your normal diet. Heavy or fried foods are harder to digest and may make you feel sick to your stomach (nauseated).  Avoid alcoholic beverages for 24 hours or as instructed.  MEDICATIONS You may resume your normal medications unless your doctor tells you otherwise.  WHAT YOU CAN EXPECT TODAY Some feelings of bloating in the abdomen.  Passage of more gas than usual.  Spotting of blood in your stool or on the toilet paper.  IF YOU HAD POLYPS REMOVED DURING THE COLONOSCOPY: No aspirin products for 7 days or as instructed.  No alcohol for 7 days or as instructed.  Eat a soft diet for the next 24 hours.  FINDING OUT THE RESULTS OF YOUR TEST Not all test results are available during your visit. If your test results are not back during the visit, make an appointment with your caregiver to find out the  results. Do not assume everything is normal if you have not heard from your caregiver or the medical facility. It is important for you to follow up on all of your test results.  SEEK IMMEDIATE MEDICAL ATTENTION IF: You have more than a spotting of blood in your stool.  Your belly is swollen (abdominal distention).  You are nauseated or vomiting.  You have a temperature over 101.  You have abdominal pain or discomfort that is severe or gets worse throughout the day.     2 polyps found and removed   further recommendations to follow pending review of pathology report "

## 2024-05-31 NOTE — Anesthesia Preprocedure Evaluation (Signed)
"                                    Anesthesia Evaluation  Patient identified by MRN, date of birth, ID band Patient awake    Reviewed: Allergy & Precautions, H&P , NPO status , Patient's Chart, lab work & pertinent test results  History of Anesthesia Complications (+) PONV and history of anesthetic complications  Airway Mallampati: II  TM Distance: >3 FB Neck ROM: Full    Dental no notable dental hx.    Pulmonary neg pulmonary ROS, Current Smoker   Pulmonary exam normal breath sounds clear to auscultation       Cardiovascular negative cardio ROS Normal cardiovascular exam Rhythm:Regular Rate:Normal     Neuro/Psych negative neurological ROS  negative psych ROS   GI/Hepatic negative GI ROS, Neg liver ROS,,,  Endo/Other  negative endocrine ROS    Renal/GU negative Renal ROS  negative genitourinary   Musculoskeletal negative musculoskeletal ROS (+)    Abdominal   Peds negative pediatric ROS (+)  Hematology negative hematology ROS (+)   Anesthesia Other Findings   Reproductive/Obstetrics negative OB ROS                              Anesthesia Physical Anesthesia Plan  ASA: 2  Anesthesia Plan: General   Post-op Pain Management:    Induction: Intravenous  PONV Risk Score and Plan:   Airway Management Planned: Nasal Cannula and Natural Airway  Additional Equipment:   Intra-op Plan:   Post-operative Plan:   Informed Consent: I have reviewed the patients History and Physical, chart, labs and discussed the procedure including the risks, benefits and alternatives for the proposed anesthesia with the patient or authorized representative who has indicated his/her understanding and acceptance.     Dental advisory given  Plan Discussed with: CRNA  Anesthesia Plan Comments:         Anesthesia Quick Evaluation  "

## 2024-06-03 ENCOUNTER — Encounter (HOSPITAL_COMMUNITY): Payer: Self-pay | Admitting: Internal Medicine

## 2024-06-05 LAB — SURGICAL PATHOLOGY

## 2024-06-06 ENCOUNTER — Ambulatory Visit: Payer: Self-pay | Admitting: Internal Medicine

## 2024-09-11 ENCOUNTER — Ambulatory Visit: Admitting: Family Medicine
